# Patient Record
Sex: Female | Born: 1935 | Race: White | Hispanic: No | Smoking: Never smoker
Health system: Southern US, Community
[De-identification: ages and names within clinical notes are randomized; demographics above are authoritative.]

## PROBLEM LIST (undated history)

## (undated) DIAGNOSIS — J309 Allergic rhinitis, unspecified: Secondary | ICD-10-CM

## (undated) DIAGNOSIS — K648 Other hemorrhoids: Secondary | ICD-10-CM

## (undated) DIAGNOSIS — K5730 Diverticulosis of large intestine without perforation or abscess without bleeding: Secondary | ICD-10-CM

## (undated) DIAGNOSIS — K602 Anal fissure, unspecified: Secondary | ICD-10-CM

## (undated) DIAGNOSIS — K219 Gastro-esophageal reflux disease without esophagitis: Secondary | ICD-10-CM

## (undated) DIAGNOSIS — E785 Hyperlipidemia, unspecified: Secondary | ICD-10-CM

## (undated) DIAGNOSIS — M1990 Unspecified osteoarthritis, unspecified site: Secondary | ICD-10-CM

## (undated) DIAGNOSIS — G629 Polyneuropathy, unspecified: Secondary | ICD-10-CM

## (undated) DIAGNOSIS — J45909 Unspecified asthma, uncomplicated: Secondary | ICD-10-CM

## (undated) HISTORY — DX: Gastro-esophageal reflux disease without esophagitis: K21.9

## (undated) HISTORY — DX: Diverticulosis of large intestine without perforation or abscess without bleeding: K57.30

## (undated) HISTORY — DX: Hyperlipidemia, unspecified: E78.5

## (undated) HISTORY — DX: Allergic rhinitis, unspecified: J30.9

## (undated) HISTORY — PX: HEMORRHOID SURGERY: SHX153

## (undated) HISTORY — DX: Other hemorrhoids: K64.8

## (undated) HISTORY — DX: Polyneuropathy, unspecified: G62.9

## (undated) HISTORY — DX: Unspecified osteoarthritis, unspecified site: M19.90

## (undated) HISTORY — PX: TONSILLECTOMY: SUR1361

## (undated) HISTORY — DX: Unspecified asthma, uncomplicated: J45.909

## (undated) HISTORY — PX: NASAL SINUS SURGERY: SHX719

## (undated) HISTORY — DX: Anal fissure, unspecified: K60.2

## (undated) HISTORY — PX: BIOPSY BREAST: PRO8

---

## 1998-01-13 ENCOUNTER — Other Ambulatory Visit: Admission: RE | Admit: 1998-01-13 | Discharge: 1998-01-13 | Payer: Self-pay | Admitting: Obstetrics and Gynecology

## 1999-02-01 ENCOUNTER — Other Ambulatory Visit: Admission: RE | Admit: 1999-02-01 | Discharge: 1999-02-01 | Payer: Self-pay | Admitting: Family Medicine

## 2000-01-17 ENCOUNTER — Other Ambulatory Visit: Admission: RE | Admit: 2000-01-17 | Discharge: 2000-01-17 | Payer: Self-pay | Admitting: Family Medicine

## 2001-01-16 ENCOUNTER — Other Ambulatory Visit: Admission: RE | Admit: 2001-01-16 | Discharge: 2001-01-16 | Payer: Self-pay | Admitting: Family Medicine

## 2002-03-21 ENCOUNTER — Encounter (INDEPENDENT_AMBULATORY_CARE_PROVIDER_SITE_OTHER): Payer: Self-pay | Admitting: Gastroenterology

## 2003-02-21 ENCOUNTER — Other Ambulatory Visit: Admission: RE | Admit: 2003-02-21 | Discharge: 2003-02-21 | Payer: Self-pay | Admitting: Family Medicine

## 2005-03-23 ENCOUNTER — Other Ambulatory Visit: Admission: RE | Admit: 2005-03-23 | Discharge: 2005-03-23 | Payer: Self-pay | Admitting: Family Medicine

## 2006-03-07 ENCOUNTER — Ambulatory Visit: Payer: Self-pay | Admitting: Internal Medicine

## 2007-02-12 ENCOUNTER — Encounter: Admission: RE | Admit: 2007-02-12 | Discharge: 2007-02-12 | Payer: Self-pay | Admitting: Family Medicine

## 2007-03-06 DIAGNOSIS — J309 Allergic rhinitis, unspecified: Secondary | ICD-10-CM | POA: Insufficient documentation

## 2007-03-06 DIAGNOSIS — J45909 Unspecified asthma, uncomplicated: Secondary | ICD-10-CM

## 2007-03-06 DIAGNOSIS — J4520 Mild intermittent asthma, uncomplicated: Secondary | ICD-10-CM | POA: Insufficient documentation

## 2007-03-08 ENCOUNTER — Encounter: Payer: Self-pay | Admitting: Internal Medicine

## 2007-03-12 ENCOUNTER — Ambulatory Visit: Payer: Self-pay | Admitting: Gastroenterology

## 2008-08-01 ENCOUNTER — Ambulatory Visit: Payer: Self-pay | Admitting: Internal Medicine

## 2008-08-01 DIAGNOSIS — K219 Gastro-esophageal reflux disease without esophagitis: Secondary | ICD-10-CM

## 2008-08-08 ENCOUNTER — Ambulatory Visit (HOSPITAL_COMMUNITY): Admission: RE | Admit: 2008-08-08 | Discharge: 2008-08-08 | Payer: Self-pay | Admitting: Internal Medicine

## 2008-09-12 ENCOUNTER — Ambulatory Visit: Payer: Self-pay | Admitting: Internal Medicine

## 2008-09-18 ENCOUNTER — Telehealth (INDEPENDENT_AMBULATORY_CARE_PROVIDER_SITE_OTHER): Payer: Self-pay | Admitting: *Deleted

## 2008-09-19 ENCOUNTER — Ambulatory Visit: Payer: Self-pay | Admitting: Gastroenterology

## 2010-01-12 ENCOUNTER — Telehealth: Payer: Self-pay | Admitting: Gastroenterology

## 2010-03-16 NOTE — Progress Notes (Signed)
Summary: refill  Phone Note Other Incoming Call back at [facility] Phone [phone]   Caller: Pt's husband Details for Reason: Refill Summary of Call: Pt. needs refill of ranitidine, 90-day supply, with 3 refills faxed to [facility] (mail order). Any questions, please call. Initial call taken by: Schuyler Amor,  January 12, 2010 11:10 AM    Prescriptions: RANITIDINE HCL 300 MG  TABS (RANITIDINE HCL) take one pill shortly before bedtime every night  #90 x 3   Entered by:   Chales Abrahams CMA (AAMA)   Authorized by:   Rachael Fee MD   Signed by:   Chales Abrahams CMA (AAMA) on 01/12/2010   Method used:   Electronically to        [facility] (retail)             ,          Ph: [phone]       Fax: [phone]   RxID:   [id number]

## 2010-06-29 NOTE — Assessment & Plan Note (Signed)
[facility]                         GASTROENTEROLOGY OFFICE NOTE   CARO, BRUNDIDGE                   MRN:          [id number]  DATE:03/12/2007                            DOB:          06-23-1934    PRIMARY CARE PHYSICIAN:  Dr. Talmadge Coventry.   REASON FOR REFERRAL:  Dr. Smith Mince asked me to evaluate Sherry Guzman in  consultation regarding chronic left lower quadrant pain.   HISTORY OF PRESENT ILLNESS:  Sherry Guzman is a very pleasant 76 year old  woman who has had 6-8 months of left lower quadrant discomfort.  She  said in June it was pretty severe.  She was evaluated by Dr. Smith Mince  who thought that she might have some diverticular issues.  She was not  started specifically on antibiotics, but since then her symptoms have  generally improved, although she still has a discomfort in the left  lower quadrant.  She has been avoiding nuts and berries and  strawberries.  She says around the same time her bowel movements changed  and she is having rather scybalous stools.  She has a feeling of  incomplete evacuation.  She has had no rectal bleeding.  She began  taking Senokot and noticed some improvement in her symptoms.  Her  daughter is a colonic hydrotherapist and she underwent 3 full colon  cleansings and said after the colon cleansings she felt completely back  to normal, but the gradually over several says she began having the  discomfort again.  She has had no fevers or chills.  She has had some  lab tests and imaging studies to work this up.  Lab tests done in  December 2008:  CBC was completely normal, complete metabolic profile  was normal, her TSH was normal, she did have an abdominal urinalysis and  was started on antibiotics; this was an E. coli UTI.  She had a CT scan  also December 2008 with IV and oral contrast, normal abdomen, normal  pelvis.   REVIEW OF SYSTEMS:  Notable for no rectal bleeding, stable weight,  otherwise  is essentially normal and is available on her nursing intake  sheet.   PAST MEDICAL HISTORY:  1. Elevated cholesterol.  2. Status post hemorrhoid surgery 30 years ago.  3. Breast surgery 30 years ago.   CURRENT MEDICINES:  Multivitamin and calcium.   ALLERGIES:  NO KNOWN DRUG ALLERGIES.   SOCIAL HISTORY:  Married, one daughter, retired from [employer].  Nonsmoker, nondrinker.   FAMILY HISTORY:  No colon cancer, colon polyps in family.   PHYSICAL EXAMINATION:  Five foot five inches, 144 pounds, blood pressure  124/72, pulse 80.  CONSTITUTIONAL:  Generally well-appearing.  NEUROLOGIC:  Alert and oriented x3.  EYES:  Extraocular movements intact.  MOUTH:  Oropharynx moist, no lesions.  NECK:  Supple no lymphadenopathy.  CARDIOVASCULAR:  Heart regular rate and rhythm.  LUNGS:  Clear to auscultation bilaterally.  ABDOMEN:  Soft, nontender, nondistended, normal bowel sounds.  EXTREMITIES:  No lower extremity edema.  SKIN:  No rashes or lesions on visible extremities.   ASSESSMENT/PLAN:  A 76 year old woman with chronic left lower  quadrant  discomfort, change in bowel habits around the same time (6-8 months).  I  did not mention above but she did have a full colonoscopy with Dr. Victorino Dike February of 2004.  He describes some mild diverticulosis, is  otherwise essentially normal.  She was instructed to have another  colonoscopy 10 years from then.  I suspect her discomforts are related  to her change in bowel habits.  I have low suspicion for any malignancy  or neoplasm causing her symptoms.  That being said, will have a low  threshold to repeat colonoscopy since her last one was 5 years ago.  For  now, she will try Citrucel fiber supplementations, titrating slowly  upward and she will return to see me in 6-8 week's time and sooner if  needed.  If she is not completely back to normal after this regimen then  I would proceed with full colonoscopy before any other attempts at   therapy.     Rachael Fee, MD  Electronically Signed    DPJ/MedQ  DD: 03/12/2007  DT: 03/12/2007  Job #: [id number]   cc:   Talmadge Coventry, M.D.

## 2010-07-02 NOTE — Assessment & Plan Note (Signed)
[facility]                             PULMONARY OFFICE NOTE   ARLIN, SAVONA                   MRN:          [id number]  DATE:03/07/2006                            DOB:          February 01, 1935    PROBLEM:  1. Allergic rhinitis.  2. Asthma.   HISTORY:  This never smoker had been followed at [facility] where I last saw her in 2002.  We followed her  husband and she wants to reestablish for follow up of her respiratory  problems here.  She had quit allergy vaccine about five years ago.  Dr.  Smith Mince is her primary physician.  Today, she complains of pain in the  right ear and cough.  She had a bronchitis that moved to her head as a  sinusitis and she woke this morning about 1 a.m. with right ear pain.  She still has a pressure sensation in that ear.  She had been treating  herself with Sudafed and saline nasal lavage.   MEDICATIONS:  Omacor, aspirin 81 mg, multivitamins.   No medication allergy.   OBJECTIVE:  Weight 144 pounds.  BP 110/68, pulse regular 81, room air  saturation 98%.  External canal on the right is red.  Tympanic membrane  looks okay.  Left ear looks good.  I find no adenopathy.  Pharynx is not  red.  There is a little nasal stuffiness with no postnasal drainage.  No  periorbital edema.  Chest is clear.  Heart sounds regular without  murmur.   IMPRESSION:  Right otitis externa.   PLAN:  1. Augmentin 875 mg b.i.d. for seven days.  2. A-B Otic ear drops b.i.d. in ear as needed for comfort.  3. Continue Sudafed and saline lavage as needed.  4. If she fails to clear within the week, she is to let us know.  If      she clears and feels well, then I will see her in a year, earlier      p.r.n.     Clinton D. Maple Hudson, MD, [facility] Bollman, FACP  Electronically Signed    CDY/MedQ  DD: 03/11/2006  DT: 03/11/2006  Job #: [id number]

## 2010-11-12 ENCOUNTER — Other Ambulatory Visit: Payer: Self-pay | Admitting: Gastroenterology

## 2010-11-15 MED ORDER — RANITIDINE HCL 150 MG PO TABS: 150.0000 mg | ORAL_TABLET | Freq: Two times a day (BID) | ORAL | Status: DC

## 2010-11-15 NOTE — Telephone Encounter (Signed)
Pt appt made to follow up for med refill

## 2010-12-10 ENCOUNTER — Other Ambulatory Visit: Payer: Self-pay

## 2010-12-17 ENCOUNTER — Encounter: Payer: Self-pay | Admitting: Gastroenterology

## 2010-12-17 ENCOUNTER — Ambulatory Visit (INDEPENDENT_AMBULATORY_CARE_PROVIDER_SITE_OTHER): Payer: Medicare Other | Admitting: Gastroenterology

## 2010-12-17 VITALS — BP 122/68 | HR 80 | Ht 65.0 in | Wt 143.0 lb

## 2010-12-17 DIAGNOSIS — K219 Gastro-esophageal reflux disease without esophagitis: Secondary | ICD-10-CM

## 2010-12-17 MED ORDER — RANITIDINE HCL 300 MG PO TABS: 300.0000 mg | ORAL_TABLET | Freq: Every day | ORAL | Status: DC

## 2010-12-17 MED ORDER — AZELASTINE HCL 0.15 % NA SOLN: 205.5000 ug | Freq: Every day | NASAL | Status: DC | PRN

## 2010-12-17 NOTE — Progress Notes (Signed)
Review of gastrointestinal problems:  1. Routine risk for colon cancer, last colonoscopy with Dr. Corinda Gubler 2004 showed diverticulosis but no polyps. Next colonoscopy February 2014.  2. GERD: intermittent chest, indigestion symptoms. Improved with H2 blocker. Reflux documented on 2010 upper GI   HPI: This is a  very pleasant 76 year old woman who I last saw about 2 years ago.  Everything is perfect.  Zantac 300mg  qhs works very well.  No dysphagia.  Overall stable weight. No overt gi bleeding.  She does have intermittent mild discomfort, attributes it to gas.  In past few months, her bowels have been looser than usual.  Stopped alleve and that helped, much better.    Past Medical History  Diagnosis Date  . Unspecified asthma   . Allergic rhinitis, cause unspecified   . GERD (gastroesophageal reflux disease)   . Hyperlipemia   . Diverticulosis of colon (without mention of hemorrhage)     Past Surgical History  Procedure Date  . Nasal sinus surgery   . Hemorrhoid surgery   . Biopsy breast   . Tonsillectomy     Current Outpatient Prescriptions  Medication Sig Dispense Refill  . aspirin 81 MG tablet Take 81 mg by mouth daily.        . Azelastine HCl (ASTEPRO) 0.15 % SOLN Place into the nose. As directed       . ranitidine (ZANTAC) 300 MG tablet One tablet by mouth one hour before  bedtime       . rosuvastatin (CRESTOR) 10 MG tablet Take 10 mg by mouth daily.          Allergies as of 12/17/2010  . (No Known Allergies)    Family History  Problem Relation Age of Onset  . Lung cancer Father   . Heart disease Mother   . Colon cancer Neg Hx     History   Social History  . Marital Status: Married    Spouse Name: N/A    Number of Children: N/A  . Years of Education: N/A   Occupational History  . Retired     Social History Main Topics  . Smoking status: Never Smoker   . Smokeless tobacco: Never Used  . Alcohol Use: No  . Drug Use: No  . Sexually Active: Not on file    Other Topics Concern  . Not on file   Social History Narrative  . No narrative on file      Physical Exam: BP 122/68  Pulse 80  Ht 5\' 5"  (1.651 m)  Wt 143 lb (64.864 kg)  BMI 23.80 kg/m2 Constitutional: generally well-appearing Psychiatric: alert and oriented x3 Abdomen: soft, nontender, nondistended, no obvious ascites, no peritoneal signs, normal bowel sounds     Assessment and plan: 76 y.o. female with chronic GERD, well controlled on H2 blocker, routine risk for colon cancer  She has chronic GERD without alarm symptoms on H2 blockers once nightly. And going to refill her prescription for the H2 blocker. She'll continue once nightly. She also asked for a refill on her nasal spray astepro, she takes this 2 or 3 nights a week.  I was happy refill this prescription as well. She will return to see me in 2 years for her GERD symptoms and probably sooner for routine colon cancer screening.

## 2010-12-17 NOTE — Patient Instructions (Signed)
Continue zantac at night. Continue astepro as needed. Call if any changes in GI symptoms. Return to see Dr. Christella Hartigan in 2 years for GERD, sooner if needed.

## 2011-04-07 IMAGING — RF DG ESOPHAGUS
11 series · 20 of 23 positions shown · non-contrast
Comparison: None

CLINICAL DATA: Evaluate for reflux.

ESOPHOGRAM/BARIUM SWALLOW
TECHNIQUE: Combined double contrast and single contrast
examination performed using effervescent crystals, thick barium
liquid, and thin barium liquid.
Fluoroscopy time:  3.5 minutes.

[Series 1: run · 2 of 2 slices shown (1 of 11)]
[im 1/2]
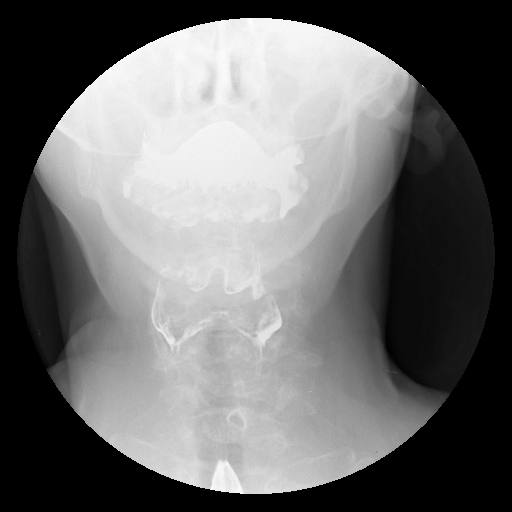
[im 2/2]
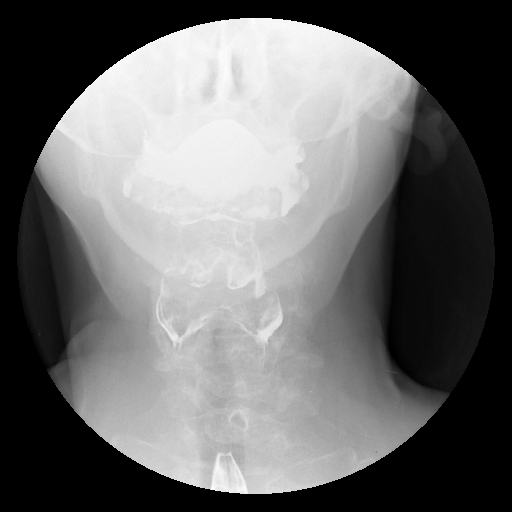

[Series 2: run · 1 of 2 slices shown (2 of 11)]
[im 1/2]
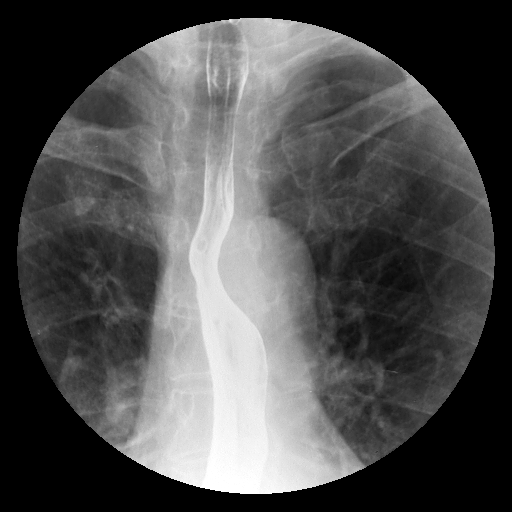

[Series 3: run · 2 of 2 slices shown (3 of 11)]
[im 1/2]
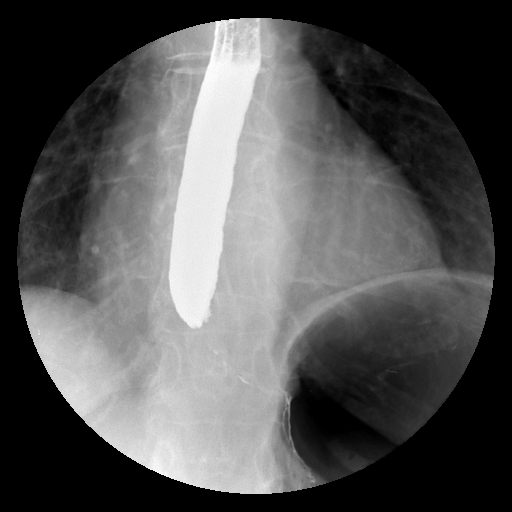
[im 2/2]
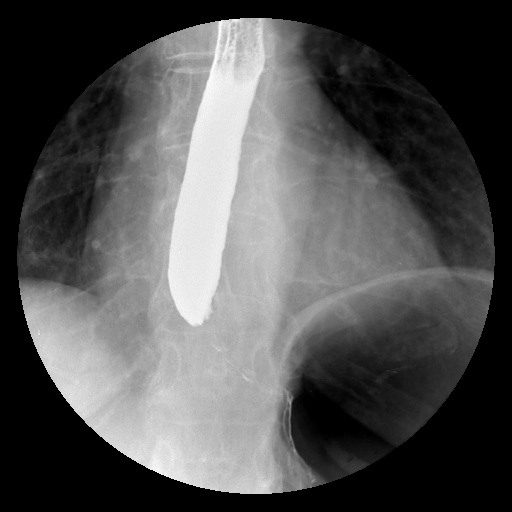

[Series 4: run · 2 of 2 slices shown (4 of 11)]
[im 1/2]
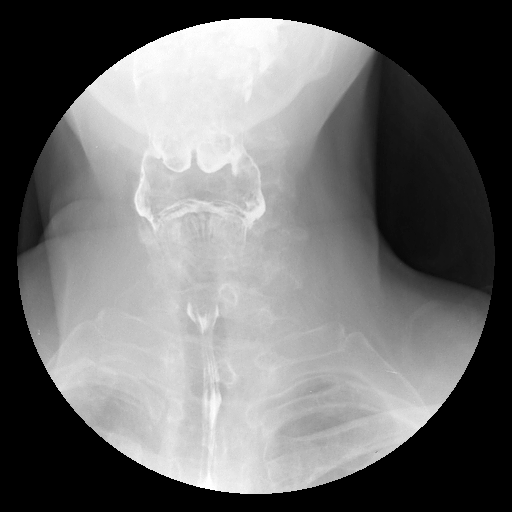
[im 2/2]
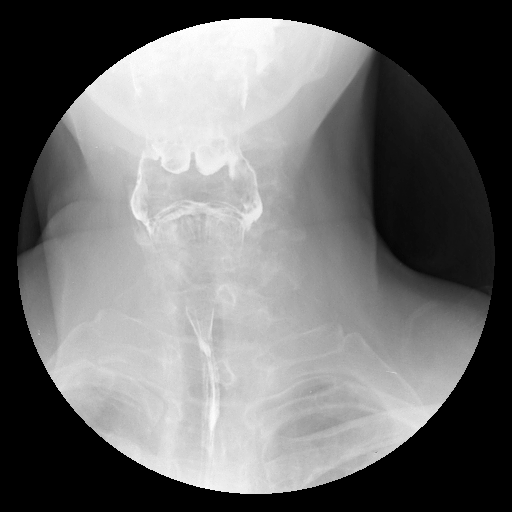

[Series 5: run · 2 of 2 slices shown (5 of 11)]
[im 1/2]
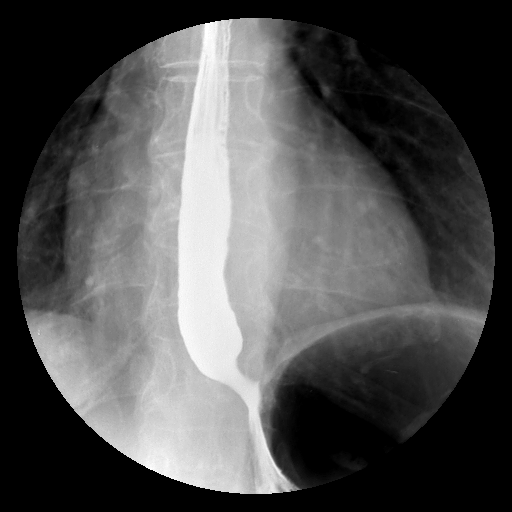
[im 2/2]
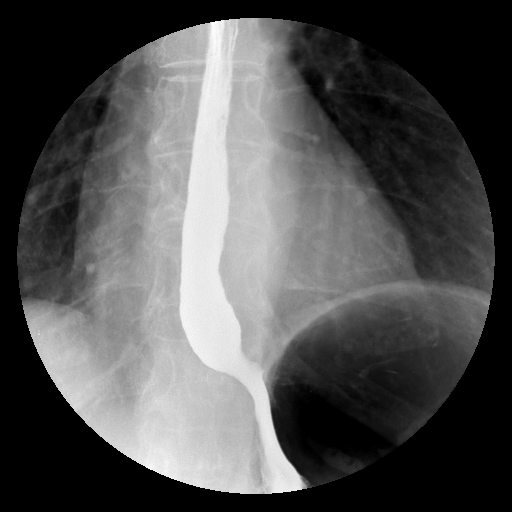

[Series 6: run · 3 of 4 slices shown (6 of 11)]
[im 1/4]
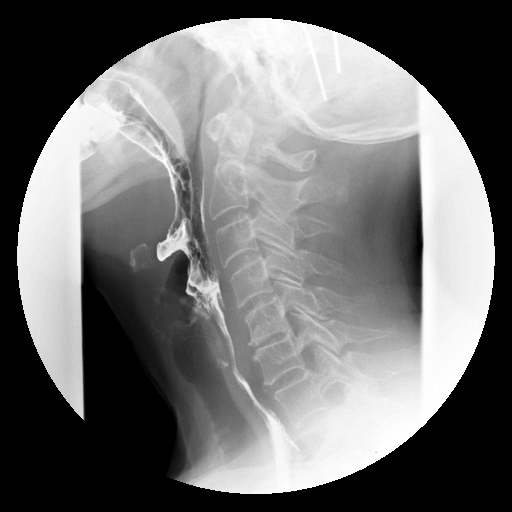
[im 3/4]
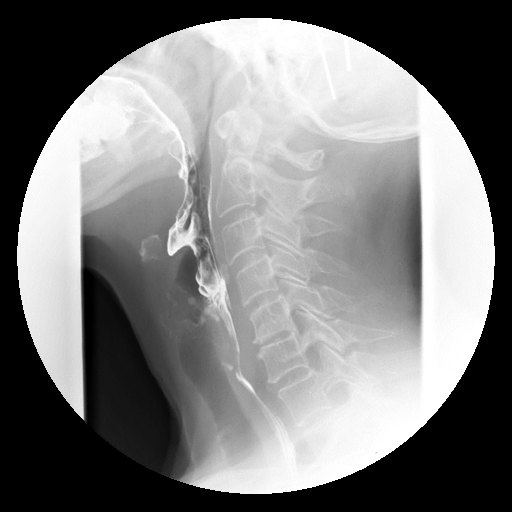
[im 4/4]
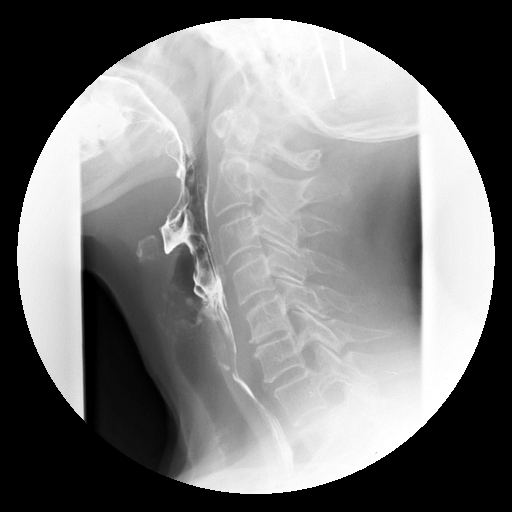

[Series 7: run · 2 of 2 slices shown (7 of 11)]
[im 1/2]
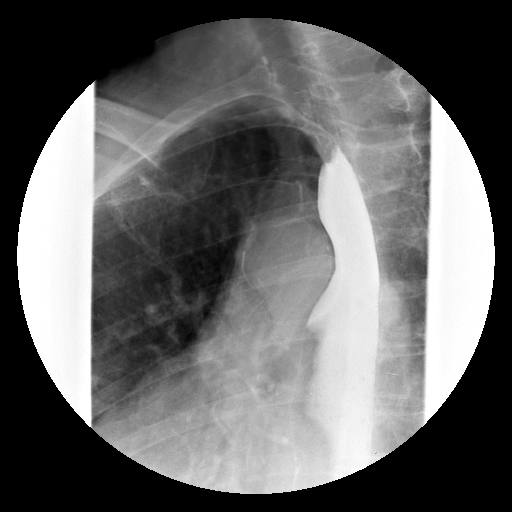
[im 2/2]
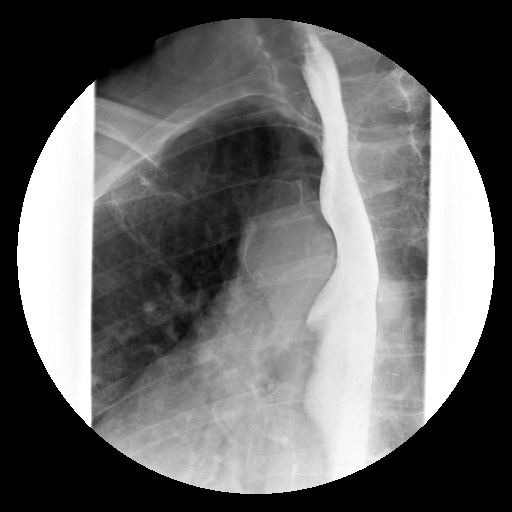

[Series 8: run · 2 of 2 slices shown (8 of 11)]
[im 1/2]
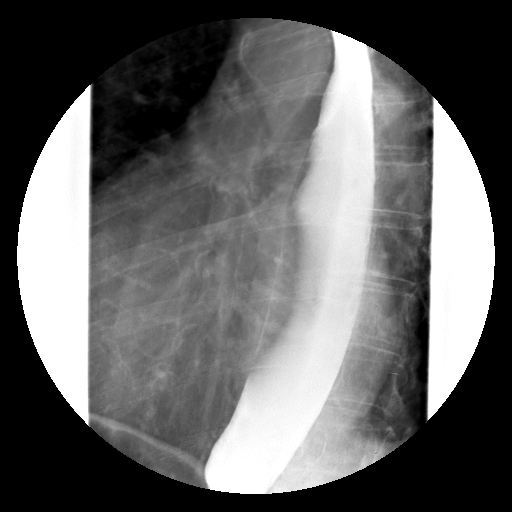
[im 2/2]
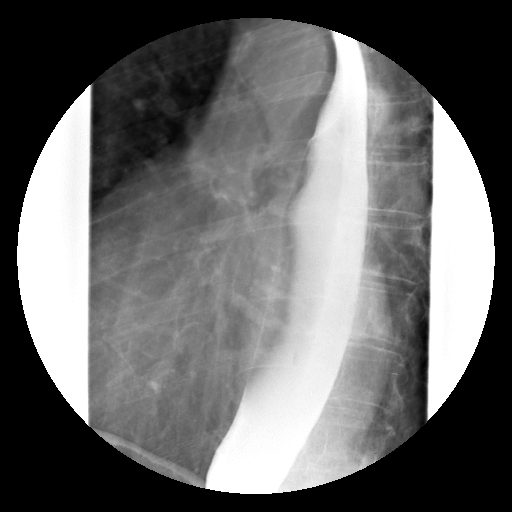

[Series 9: run · 1 of 2 slices shown (9 of 11)]
[im 1/2]
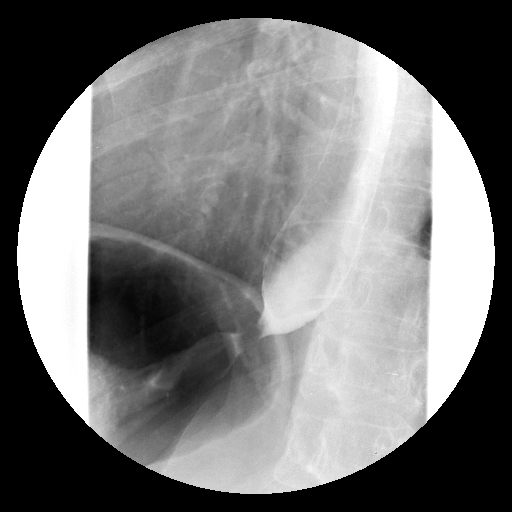

[Series 10: run · 1 of 1 slices shown (10 of 11)]
[im 1/1]
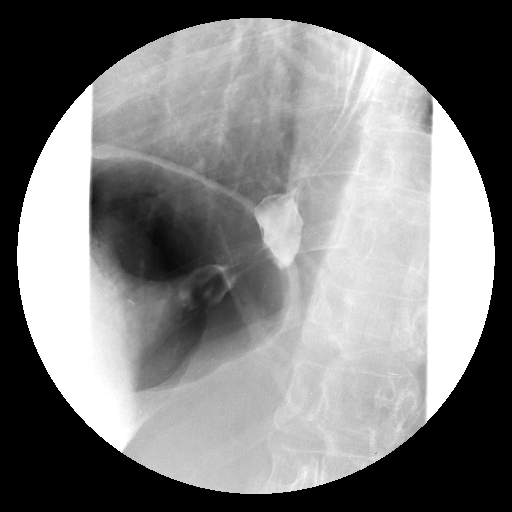

[Series 11: run · 2 of 2 slices shown (11 of 11)]
[im 1/2]
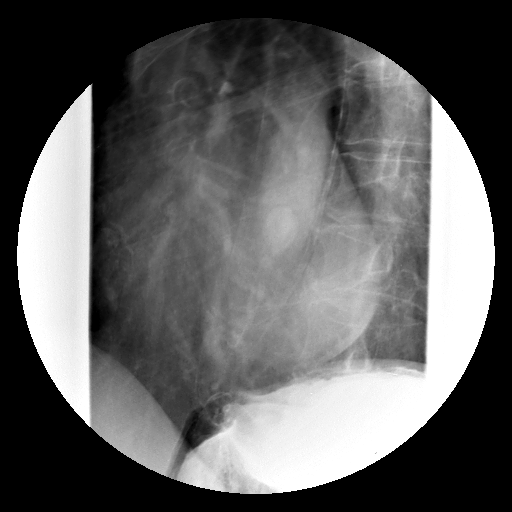
[im 2/2]
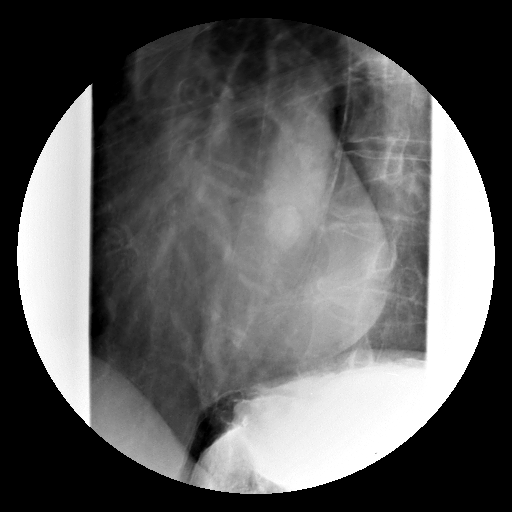

[20 of 23 positions shown; findings below may reference images not displayed]

FINDINGS: Swallowing function appeared normal.  No penetration or
aspiration of barium.  Symmetrical valleculae and piriform sinuses.
Normal esophageal motility.  No esophageal stricture.  The patient
swallowed a 13 mm barium tablet which traversed the esophagus and
entered the stomach without delay. No significant hiatal hernia.
There was moderate gastroesophageal reflux.
IMPRESSION: GE reflux.

## 2011-11-08 DIAGNOSIS — M810 Age-related osteoporosis without current pathological fracture: Secondary | ICD-10-CM | POA: Insufficient documentation

## 2011-11-28 ENCOUNTER — Other Ambulatory Visit: Payer: Self-pay | Admitting: Gastroenterology

## 2011-11-28 MED ORDER — RANITIDINE HCL 300 MG PO TABS: 300.0000 mg | ORAL_TABLET | Freq: Every day | ORAL | Status: DC

## 2011-11-28 NOTE — Telephone Encounter (Signed)
Pt aware medication has been sent to the pharmacy

## 2012-01-25 ENCOUNTER — Ambulatory Visit: Payer: Medicare Other | Attending: Family Medicine

## 2012-01-25 DIAGNOSIS — M25519 Pain in unspecified shoulder: Secondary | ICD-10-CM | POA: Insufficient documentation

## 2012-01-25 DIAGNOSIS — IMO0001 Reserved for inherently not codable concepts without codable children: Secondary | ICD-10-CM | POA: Insufficient documentation

## 2012-03-02 ENCOUNTER — Encounter: Payer: Self-pay | Admitting: Gastroenterology

## 2012-03-19 ENCOUNTER — Encounter: Payer: Self-pay | Admitting: Gastroenterology

## 2012-05-25 ENCOUNTER — Ambulatory Visit (AMBULATORY_SURGERY_CENTER): Payer: Medicare Other

## 2012-05-25 VITALS — Ht 65.0 in | Wt 138.0 lb

## 2012-05-25 DIAGNOSIS — Z1211 Encounter for screening for malignant neoplasm of colon: Secondary | ICD-10-CM

## 2012-05-25 MED ORDER — MOVIPREP 100 G PO SOLR: 1.0000 | Freq: Once | ORAL | Status: DC

## 2012-06-08 ENCOUNTER — Encounter: Payer: Self-pay | Admitting: Gastroenterology

## 2012-06-08 ENCOUNTER — Ambulatory Visit (AMBULATORY_SURGERY_CENTER): Payer: Medicare Other | Admitting: Gastroenterology

## 2012-06-08 VITALS — BP 123/67 | HR 58 | Temp 96.4°F | Resp 16 | Ht 65.0 in | Wt 138.0 lb

## 2012-06-08 DIAGNOSIS — Z1211 Encounter for screening for malignant neoplasm of colon: Secondary | ICD-10-CM

## 2012-06-08 MED ORDER — SODIUM CHLORIDE 0.9 % IV SOLN: 500.0000 mL | INTRAVENOUS | Status: DC

## 2012-06-08 NOTE — Op Note (Signed)
[facility] [address] [place], [address]   COLONOSCOPY PROCEDURE REPORT  PATIENT: Sherry Guzman, Sherry Guzman  MR#: [id number] BIRTHDATE: [birth year] , 77  yrs. old GENDER: Female ENDOSCOPIST: Rachael Fee, MD PROCEDURE DATE:  06/08/2012 PROCEDURE:   Colonoscopy, screening ASA CLASS:   Class II INDICATIONS:average risk screening. MEDICATIONS: Fentanyl 75 mcg IV, Versed 7 mg IV, and These medications were titrated to patient response per physician's verbal order  DESCRIPTION OF PROCEDURE:   After the risks benefits and alternatives of the procedure were thoroughly explained, informed consent was obtained.  A digital rectal exam revealed no abnormalities of the rectum.   The LB [id number]-[id number]  endoscope was introduced through the anus and advanced to the cecum, which was identified by both the appendix and ileocecal valve. No adverse events experienced.   The quality of the prep was good.  The instrument was then slowly withdrawn as the colon was fully examined.   COLON FINDINGS: A normal appearing cecum, ileocecal valve, and appendiceal orifice were identified.  The ascending, hepatic flexure, transverse, splenic flexure, descending, sigmoid colon and rectum appeared unremarkable.  No polyps or cancers were seen. Retroflexed views revealed no abnormalities. The time to cecum=10 minutes 21 seconds.  Withdrawal time=4 minutes 54 seconds.  The scope was withdrawn and the procedure completed. COMPLICATIONS: There were no complications.  ENDOSCOPIC IMPRESSION: Tortuous but otherwise normal colon No polyps or cancers  RECOMMENDATIONS: Given your age, you will not need another colonoscopy for colon cancer screening or polyp surveillance.  These types of tests usually stop around the age 42-80.   eSigned:  Rachael Fee, MD 06/08/2012 1:45 PM

## 2012-06-08 NOTE — Patient Instructions (Addendum)

## 2012-06-08 NOTE — Progress Notes (Signed)
Tortuous colon noted by Dr. Christella Hartigan.

## 2012-06-08 NOTE — Progress Notes (Signed)
Patient did not experience any of the following events: a burn prior to discharge; a fall within the facility; wrong site/side/patient/procedure/implant event; or a hospital transfer or hospital admission upon discharge from the facility. (G8907) Patient did not have preoperative order for IV antibiotic SSI prophylaxis. (G8918)  

## 2012-06-11 ENCOUNTER — Telehealth: Payer: Self-pay

## 2012-06-11 NOTE — Telephone Encounter (Signed)
  Follow up Call-  Call back number 06/08/2012  Post procedure Call Back phone  # [phone]  Permission to leave phone message Yes     Patient questions:  Do you have a fever, pain , or abdominal swelling? no Pain Score  0 *  Have you tolerated food without any problems? yes  Have you been able to return to your normal activities? yes  Do you have any questions about your discharge instructions: Diet   no Medications  no Follow up visit  no  Do you have questions or concerns about your Care? no  Actions: * If pain score is 4 or above: No action needed, pain <4.

## 2012-10-05 DIAGNOSIS — H04129 Dry eye syndrome of unspecified lacrimal gland: Secondary | ICD-10-CM | POA: Insufficient documentation

## 2012-10-19 ENCOUNTER — Other Ambulatory Visit: Payer: Self-pay | Admitting: Gastroenterology

## 2013-06-18 ENCOUNTER — Other Ambulatory Visit: Payer: Self-pay | Admitting: Gastroenterology

## 2013-11-23 ENCOUNTER — Other Ambulatory Visit: Payer: Self-pay | Admitting: Gastroenterology

## 2014-02-15 ENCOUNTER — Other Ambulatory Visit: Payer: Self-pay | Admitting: Gastroenterology

## 2014-04-30 ENCOUNTER — Telehealth: Payer: Self-pay | Admitting: Gastroenterology

## 2014-05-01 NOTE — Telephone Encounter (Signed)
Left message on machine to call back pt needs f/u for refills

## 2014-05-02 NOTE — Telephone Encounter (Signed)
Returning your call. °

## 2014-05-02 NOTE — Telephone Encounter (Signed)
Left message on machine to call back  

## 2014-05-05 NOTE — Telephone Encounter (Signed)
Left message per pt request that appt needs to be made before medication will be refilled.

## 2014-05-06 MED ORDER — RANITIDINE HCL 300 MG PO TABS: 300.0000 mg | ORAL_TABLET | Freq: Every day | ORAL | Status: DC

## 2014-05-06 NOTE — Telephone Encounter (Signed)
1 refill sent ROV was scheduled no further refills until appt is kept

## 2014-07-01 ENCOUNTER — Ambulatory Visit (INDEPENDENT_AMBULATORY_CARE_PROVIDER_SITE_OTHER): Payer: Medicare Other | Admitting: Gastroenterology

## 2014-07-01 ENCOUNTER — Encounter: Payer: Self-pay | Admitting: Gastroenterology

## 2014-07-01 VITALS — BP 128/62 | HR 68 | Ht 65.0 in | Wt 143.0 lb

## 2014-07-01 DIAGNOSIS — K219 Gastro-esophageal reflux disease without esophagitis: Secondary | ICD-10-CM

## 2014-07-01 MED ORDER — RANITIDINE HCL 300 MG PO TABS: 300.0000 mg | ORAL_TABLET | Freq: Every day | ORAL | Status: DC

## 2014-07-01 NOTE — Progress Notes (Signed)
Review of gastrointestinal problems:  1. Routine risk for colon cancer, last colonoscopy with Dr. Corinda GublerLeBauer 2004 showed diverticulosis but no polyps. Next colonoscopy February 2014. Colonoscopy 05/2012 was tortuous but no polyps.  NO recommended recall given age 80. GERD: intermittent chest, indigestion symptoms. Improved with H2 blocker. Reflux documented on 2010 upper GI    HPI: This is a  very pleasant 80 year old woman whom I last saw 2 or 3 years ago. She is here for her chronic GERD  Chief complaint is  GERD  Weight is unchanged.  She feels lovely.  Takes ranitidine  300 mg qhs.  as long as she takes the H2 blocker nightly at bedtime she feels very well  No dysphagia, stable weight.  No vomiting blood.  Feels great.    Past Medical History  Diagnosis Date  . Unspecified asthma(493.90)   . Allergic rhinitis, cause unspecified   . GERD (gastroesophageal reflux disease)   . Hyperlipemia   . Diverticulosis of colon (without mention of hemorrhage)     Past Surgical History  Procedure Laterality Date  . Nasal sinus surgery    . Hemorrhoid surgery    . Biopsy breast    . Tonsillectomy      Current Outpatient Prescriptions  Medication Sig Dispense Refill  . ALPRAZolam (XANAX) 0.25 MG tablet Take 0.25 mg by mouth as needed.     . Ascorbic Acid (VITAMIN C) 1000 MG tablet Take 1,000 mg by mouth daily.     Marland Kitchen. aspirin 81 MG tablet Take 81 mg by mouth daily.      . Azelastine HCl (ASTEPRO) 0.15 % SOLN Place 205.5 mcg into the nose daily as needed. 30 mL 3  . benzonatate (TESSALON) 100 MG capsule Take 100 mg by mouth as needed.     . Cholecalciferol (VITAMIN D3) 5000 UNITS TABS Take 1 tablet by mouth daily.     . cyclobenzaprine (FLEXERIL) 5 MG tablet Take 5 mg by mouth as needed.     Scarlette Shorts. IOPHEN C-NR 100-10 MG/5ML syrup Take 10 mLs by mouth as needed.     . naproxen (NAPROSYN) 500 MG tablet Take 500 mg by mouth as needed.     . ranitidine (ZANTAC) 300 MG tablet Take 1 tablet (300 mg  total) by mouth at bedtime. 90 tablet 0   No current facility-administered medications for this visit.    Allergies as of 07/01/2014 - Review Complete 07/01/2014  Allergen Reaction Noted  . Dust mite extract  07/01/2014  . Pollen extract  07/01/2014    Family History  Problem Relation Age of Onset  . Lung cancer Father   . Heart disease Mother   . Colon cancer Neg Hx     History   Social History  . Marital Status: Married    Spouse Name: N/A  . Number of Children: N/A  . Years of Education: N/A   Occupational History  . Retired     Social History Main Topics  . Smoking status: Never Smoker   . Smokeless tobacco: Never Used  . Alcohol Use: No  . Drug Use: No  . Sexual Activity: Not on file   Other Topics Concern  . Not on file   Social History Narrative     Physical Exam: Ht 5\' 5"  (1.651 m)  Wt 143 lb (64.864 kg)  BMI 23.80 kg/m2 Constitutional: generally well-appearing Psychiatric: alert and oriented x3 Abdomen: soft, nontender, nondistended, no obvious ascites, no peritoneal signs, normal bowel sounds   Assessment and  plan: 80 y.o. female with  chronic GERD I refilled her prescription for ranitidine 300 mg pills. I am happy to refill this again in one year from now without need for office appointment however if she still needs prescription strength medicine in 2 years I would like to see her in the office again. She has no alarm symptoms.   Rob Buntinganiel Jacobs, MD [facility] 07/01/2014, 8:24 AM

## 2014-07-01 NOTE — Patient Instructions (Signed)
New prescription called in for ranitidine 300mg , take one at bedtime nightly. Will refill in 1 year.  If you still need prescription med in 2 years, will ask that you return to office.

## 2015-06-30 ENCOUNTER — Other Ambulatory Visit: Payer: Self-pay | Admitting: Gastroenterology

## 2016-03-26 ENCOUNTER — Other Ambulatory Visit: Payer: Self-pay | Admitting: Gastroenterology

## 2016-12-21 ENCOUNTER — Other Ambulatory Visit: Payer: Self-pay | Admitting: Gastroenterology

## 2017-03-07 ENCOUNTER — Telehealth: Payer: Self-pay | Admitting: Gastroenterology

## 2017-03-07 NOTE — Telephone Encounter (Signed)
Pt complains of pain when sitting with hemorrhoids, uses "lidocaine cream" daily. PCP and was told internal and external.   She was also given suppository to use daily. No bleeding, no pain with BM.  Has burning in the area.  Bowel movements are normal.  PCP recommends banding.  Please advise, can I set up with on of the docs for banding?

## 2017-03-07 NOTE — Telephone Encounter (Signed)
The pt has been scheduled for banding appt with Dr Leone PayorGessner.  Pt aware and is not on any blood thinner.

## 2017-03-07 NOTE — Telephone Encounter (Signed)
That is OK with me, thanks  

## 2017-03-13 ENCOUNTER — Ambulatory Visit (INDEPENDENT_AMBULATORY_CARE_PROVIDER_SITE_OTHER): Payer: Medicare Other | Admitting: Internal Medicine

## 2017-03-13 ENCOUNTER — Encounter: Payer: Self-pay | Admitting: Internal Medicine

## 2017-03-13 VITALS — BP 110/70 | HR 74 | Ht 65.0 in | Wt 145.1 lb

## 2017-03-13 DIAGNOSIS — K6289 Other specified diseases of anus and rectum: Secondary | ICD-10-CM | POA: Diagnosis not present

## 2017-03-13 DIAGNOSIS — M533 Sacrococcygeal disorders, not elsewhere classified: Secondary | ICD-10-CM | POA: Diagnosis not present

## 2017-03-13 NOTE — Progress Notes (Addendum)
Sherry Guzman 82 y.o. 12/09/1934 [id number]  Assessment & Plan:   Encounter Diagnoses  Name Primary?  . Coccydynia Yes  . Rectal pain    1. Coccydynia- Pt's presentation likely d/t MSK etiology. Pain with palpation of coccyx that is aggravated with sitting and hx of moving heavy loads support the dx of coccydynia. Colonoscopy not warranted. Pt told she can stop suppositories and to do symptomatic tx with tylenol, aleve, and ice packs. Follow up with PCP if pain returns or worsens for ortho/sports medicine referral.  I have personally seen the patient, and formulate and plan the student has documented for me as a a scribe.  Sounds like prior fissure but current sxs and findings most consistent w/ coccodynia  Sherry Booparl E. Gessner, MD, [facility]  Cc;Sherry Guzman, Sherry EkeKaren L, MD   Subjective:   Chief Complaint: Rectal pain  HPI Pt is a 83 yo very nice female presenting today with complains of worsening rectal pain for a few weeks. She was seen at her PCP and was dx'd with internal/external hemorrhoids, and tx'd with lidocaine cream daily and suppository to use daily.   Today she reports that her pain is a lot better but would like a consult. The pain first started in the fall, she noted the pain after moving heavy loads of fertilizer. Since then the pain has come and gone with the most recent episode starting few weeks ago. Her pain is dull and achy that only occurs with sitting. She does not have have sharp/tearing pains, itching, bleeding. Her bowel movements are nml. No other changes otherwise. No pain with defecation.  In the Fall she apparently did have a tear and perhaps sharper pain in rectum assoc w/ defecaton.  Allergies  Allergen Reactions  . Dust Mite Extract   . Pollen Extract    Current Meds  Medication Sig  . ALPRAZolam (XANAX) 0.25 MG tablet Take 0.25 mg by mouth as needed.   . Ascorbic Acid (VITAMIN C) 1000 MG tablet Take 1,000 mg by mouth daily.   Marland Kitchen. aspirin 81 MG tablet  Take 81 mg by mouth daily.    . Cholecalciferol (VITAMIN D3) 5000 UNITS TABS Take 1 tablet by mouth daily.   Scarlette Shorts. IOPHEN C-NR 100-10 MG/5ML syrup Take 10 mLs by mouth as needed.   . ranitidine (ZANTAC) 300 MG tablet TAKE 1 TABLET AT BEDTIME  . rosuvastatin (CRESTOR) 10 MG tablet Take 10 mg by mouth daily.  . [DISCONTINUED] Azelastine HCl (ASTEPRO) 0.15 % SOLN Place 205.5 mcg into the nose daily as needed.  . [DISCONTINUED] benzonatate (TESSALON) 100 MG capsule Take 100 mg by mouth as needed.   . [DISCONTINUED] cyclobenzaprine (FLEXERIL) 5 MG tablet Take 5 mg by mouth as needed.   . [DISCONTINUED] naproxen (NAPROSYN) 500 MG tablet Take 500 mg by mouth as needed.    Past Medical History:  Diagnosis Date  . Allergic rhinitis, cause unspecified   . Anal fissure   . Diverticulosis of colon (without mention of hemorrhage)   . GERD (gastroesophageal reflux disease)   . Hyperlipemia   . Internal hemorrhoids   . Osteoarthritis   . Peripheral neuropathy   . Unspecified asthma(493.90)    Past Surgical History:  Procedure Laterality Date  . BIOPSY BREAST    . HEMORRHOID SURGERY    . NASAL SINUS SURGERY    . TONSILLECTOMY     Social History   Social History Narrative   Married, retired   No alcohol, tobacco or drug use  family history includes Heart disease in her mother; Lung cancer in her father.   Review of Systems Positive for rectal pain. Negative for melena, hematochezia, change in bowel habits, appetite, constipation or diarrhea. Negative for all other systems.   Objective:   Physical Exam  @BP  110/70   Pulse 74   Ht 5\' 5"  (1.651 m)   Wt 145 lb 2 oz (65.8 kg)   BMI 24.15 kg/m @  General:  Well-developed, well-nourished and in no acute distress Eyes:  anicteric. Rectal: External: few skin tags visualized around the anal sphincter. DRE: tenderness with palpation of coccyx. Anoscope: few external hemorrhoids visualized, no signs of prolapse or thrombosis.  Neuro:  A&O x  3.  Psych:  appropriate mood and  Affect.   Data Reviewed: PCP/GI notes, meds.

## 2017-03-13 NOTE — Patient Instructions (Addendum)
If you are age 81 or older, your body mass index should be between 23-30. Your Body mass index is 24.15 kg/m. If this is out of the aforementioned range listed, please consider follow up with your Primary Care Provider.  Please follow up with your primary care- Dr. Lenard Gallowayickter.  I appreciate the opportunity to care for you. Sherry Headarl Gessner, MD, [facility]

## 2017-05-09 ENCOUNTER — Encounter: Payer: Self-pay | Admitting: Neurology

## 2017-05-09 ENCOUNTER — Ambulatory Visit (INDEPENDENT_AMBULATORY_CARE_PROVIDER_SITE_OTHER): Payer: Medicare Other | Admitting: Neurology

## 2017-05-09 VITALS — BP 112/74 | HR 67 | Ht 65.0 in | Wt 142.5 lb

## 2017-05-09 DIAGNOSIS — R202 Paresthesia of skin: Secondary | ICD-10-CM | POA: Diagnosis not present

## 2017-05-09 MED ORDER — GABAPENTIN 100 MG PO CAPS: 300.0000 mg | ORAL_CAPSULE | Freq: Every day | ORAL | 11 refills | Status: DC

## 2017-05-09 NOTE — Progress Notes (Signed)
PATIENT: Sherry Guzman DOB: 08/23/1934  Chief Complaint  Patient presents with  . Peripheral Neuropathy    Reports worsening of peripheral neuropathy, over the last two years, in her bilateral feet.  Her symptoms cause her more problems at night.  She has never tried medications for this problem.  Marland Kitchen PCP    Dois Davenport, MD     HISTORICAL  Sherry Guzman is a 83 years old female seen in refer by primary care doctor Dois Davenport, for evaluation of peripheral neuropathy, initial evaluation was on May 09, 2017.  I reviewed and summarized the referring note, she has history of hyperlipidemia, most recent LDL 81 in December 2018, normal CMP with the exception of mild elevated glucose 116, normal TSH, CBC  She is active all her life, just retired in 2018, still enjoys yard work, since 2017, she began to notice numbness burning tingly sensation on the bottom of her feet, has been persistent since then, at the evening time sitting still trying to go to bed, she felt the urge to move her leg,  She denies bilateral hands paresthesia, no gait abnormality, no bowel bladder incontinence.  REVIEW OF SYSTEMS: Full 14 system review of systems performed and notable only for allergy, ringing in ears  ALLERGIES: Allergies  Allergen Reactions  . Dust Mite Extract   . Pollen Extract     HOME MEDICATIONS: Current Outpatient Medications  Medication Sig Dispense Refill  . aspirin 81 MG tablet Take 81 mg by mouth every other day.     . Cholecalciferol (VITAMIN D3) 5000 UNITS TABS Take 1 tablet by mouth daily.     . ranitidine (ZANTAC) 300 MG tablet TAKE 1 TABLET AT BEDTIME 90 tablet 2  . rosuvastatin (CRESTOR) 10 MG tablet Take 10 mg by mouth daily.     No current facility-administered medications for this visit.     PAST MEDICAL HISTORY: Past Medical History:  Diagnosis Date  . Allergic rhinitis, cause unspecified   . Anal fissure   . Diverticulosis of colon (without  mention of hemorrhage)   . GERD (gastroesophageal reflux disease)   . Hyperlipemia   . Internal hemorrhoids   . Osteoarthritis   . Peripheral neuropathy   . Unspecified asthma(493.90)     PAST SURGICAL HISTORY: Past Surgical History:  Procedure Laterality Date  . BIOPSY BREAST    . HEMORRHOID SURGERY    . NASAL SINUS SURGERY    . TONSILLECTOMY      FAMILY HISTORY: Family History  Problem Relation Age of Onset  . Lung cancer Father   . Heart disease Mother   . Colon cancer Neg Hx   . Colon polyps Neg Hx     SOCIAL HISTORY:  Social History   Socioeconomic History  . Marital status: Married    Spouse name: Not on file  . Number of children: 1  . Years of education: 81  . Highest education level: High school graduate  Occupational History  . Occupation: Retired   Engineer, production  . Financial resource strain: Not on file  . Food insecurity:    Worry: Not on file    Inability: Not on file  . Transportation needs:    Medical: Not on file    Non-medical: Not on file  Tobacco Use  . Smoking status: Never Smoker  . Smokeless tobacco: Never Used  Substance and Sexual Activity  . Alcohol use: No  . Drug use: No  . Sexual activity:  Not on file  Lifestyle  . Physical activity:    Days per week: Not on file    Minutes per session: Not on file  . Stress: Not on file  Relationships  . Social connections:    Talks on phone: Not on file    Gets together: Not on file    Attends religious service: Not on file    Active member of club or organization: Not on file    Attends meetings of clubs or organizations: Not on file    Relationship status: Not on file  . Intimate partner violence:    Fear of current or ex partner: Not on file    Emotionally abused: Not on file    Physically abused: Not on file    Forced sexual activity: Not on file  Other Topics Concern  . Not on file  Social History Narrative   Married, retired   No alcohol, tobacco or drug use    Right-handed.   4 cups of half/half caffeine of coffee, 1 glass of tea at lunch.     PHYSICAL EXAM   Vitals:   05/09/17 1028  BP: 112/74  Pulse: 67  Weight: 142 lb 8 oz (64.6 kg)  Height: 5\' 5"  (1.651 m)    Not recorded      Body mass index is 23.71 kg/m.  PHYSICAL EXAMNIATION:  Gen: NAD, conversant, well nourised, obese, well groomed                     Cardiovascular: Regular rate rhythm, no peripheral edema, warm, nontender. Eyes: Conjunctivae clear without exudates or hemorrhage Neck: Supple, no carotid bruits. Pulmonary: Clear to auscultation bilaterally   NEUROLOGICAL EXAM:  MENTAL STATUS: Speech:    Speech is normal; fluent and spontaneous with normal comprehension.  Cognition:     Orientation to time, place and person     Normal recent and remote memory     Normal Attention span and concentration     Normal Language, naming, repeating,spontaneous speech     Fund of knowledge   CRANIAL NERVES: CN II: Visual fields are full to confrontation. Fundoscopic exam is normal with sharp discs and no vascular changes. Pupils are round equal and briskly reactive to light. CN III, IV, VI: extraocular movement are normal. No ptosis. CN V: Facial sensation is intact to pinprick in all 3 divisions bilaterally. Corneal responses are intact.  CN VII: Face is symmetric with normal eye closure and smile. CN VIII: Hearing is normal to rubbing fingers CN IX, X: Palate elevates symmetrically. Phonation is normal. CN XI: Head turning and shoulder shrug are intact CN XII: Tongue is midline with normal movements and no atrophy.  MOTOR: There is no pronator drift of out-stretched arms. Muscle bulk and tone are normal. Muscle strength is normal.  REFLEXES: Reflexes are 2+ and symmetric at the biceps, triceps, knees, and ankles. Plantar responses are flexor.  SENSORY: Mildly less dependent decreased to light touch, pinprick, and vibratory sensation to ankle  level  COORDINATION: Rapid alternating movements and fine finger movements are intact. There is no dysmetria on finger-to-nose and heel-knee-shin.    GAIT/STANCE: Posture is normal. Gait is steady with normal steps, base, arm swing, and turning. Heel and toe walking are normal. Tandem gait is normal.  Romberg is absent.   DIAGNOSTIC DATA (LABS, IMAGING, TESTING) - I reviewed patient records, labs, notes, testing and imaging myself where available.   ASSESSMENT AND PLAN  Sherry Guzman is  a 83 y.o. female    Mild length dependent sensory changes, bilateral feet paresthesia  Most consistent with small fiber neuropathy   laboratory evaluations for etiology  Gabapentin 100 mg titrating to 300 mg as needed    Levert Feinstein, M.D. Ph.D.  [facility] [address], [address] [place], [place] [address] Ph: [phone] Fax: [phone]  CC: Dois Davenport, MD

## 2017-05-12 LAB — PROTEIN ELECTROPHORESIS
A/G RATIO SPE: 1.5 (ref 0.7–1.7)
Albumin ELP: 4.1 g/dL (ref 2.9–4.4)
Alpha 1: 0.2 g/dL (ref 0.0–0.4)
Alpha 2: 0.7 g/dL (ref 0.4–1.0)
BETA: 1.2 g/dL (ref 0.7–1.3)
GAMMA GLOBULIN: 0.6 g/dL (ref 0.4–1.8)
GLOBULIN, TOTAL: 2.7 g/dL (ref 2.2–3.9)
Total Protein: 6.8 g/dL (ref 6.0–8.5)

## 2017-05-12 LAB — VITAMIN B12: Vitamin B-12: 1281 pg/mL — ABNORMAL HIGH (ref 232–1245)

## 2017-05-12 LAB — C-REACTIVE PROTEIN: CRP: 0.6 mg/L (ref 0.0–4.9)

## 2017-05-12 LAB — RPR: RPR Ser Ql: NONREACTIVE

## 2017-05-12 LAB — HGB A1C W/O EAG: Hgb A1c MFr Bld: 6.1 % — ABNORMAL HIGH (ref 4.8–5.6)

## 2017-05-12 LAB — SEDIMENTATION RATE: Sed Rate: 4 mm/hr (ref 0–40)

## 2017-05-12 LAB — ANA W/REFLEX IF POSITIVE: Anti Nuclear Antibody(ANA): NEGATIVE

## 2017-05-12 LAB — FOLATE

## 2017-05-15 ENCOUNTER — Telehealth: Payer: Self-pay | Admitting: Neurology

## 2017-05-15 NOTE — Telephone Encounter (Signed)
Francis:  Laboratory evaluation showed mild elevated A1c 6.1, suggest mild elevated glucose, does not fit the diagnostic criteria for diabetes, but she should started diet control, moderate exercise.

## 2017-05-15 NOTE — Telephone Encounter (Signed)
Spoke to patient - she is aware of results.  She is agreeable to monitor her diet and exercise.

## 2017-08-15 ENCOUNTER — Ambulatory Visit: Payer: Medicare Other | Admitting: Neurology

## 2017-09-18 ENCOUNTER — Other Ambulatory Visit: Payer: Self-pay | Admitting: Gastroenterology

## 2017-09-27 ENCOUNTER — Telehealth: Payer: Self-pay | Admitting: Neurology

## 2017-09-27 DIAGNOSIS — R7309 Other abnormal glucose: Secondary | ICD-10-CM | POA: Insufficient documentation

## 2017-09-27 DIAGNOSIS — R7303 Prediabetes: Secondary | ICD-10-CM | POA: Insufficient documentation

## 2017-09-27 NOTE — Telephone Encounter (Signed)
Labwork done in the office on 3/26. Insurance has paid all except for hemoglobin labs. They are requiring the reason for the testing. She has spoken with [facility] on several occassions, she has spoken with our billing dept in the past and was advised to contact [facility]. Today she got a bill that said it will be going to collections. The pt is very frustrated at this point. Please call to help with this matter.

## 2017-09-27 NOTE — Telephone Encounter (Addendum)
I called [facility] [phone]) and spoke to BruleKitty in billing.  Patient received a bill for her Hgb A1c (invoice#[id number]).  They simply needed updated ICD10 codes.  We provided R79.9 (abnormal lab finding) and R73.09 (elevated blood glucose).  These codes should be sufficient for Medicare to provide coverage.  The patient can disregard her current bill and it will be re-filed by [facility].

## 2017-11-14 ENCOUNTER — Ambulatory Visit (INDEPENDENT_AMBULATORY_CARE_PROVIDER_SITE_OTHER): Payer: Medicare Other | Admitting: Neurology

## 2017-11-14 ENCOUNTER — Encounter: Payer: Self-pay | Admitting: Neurology

## 2017-11-14 VITALS — BP 132/68 | HR 64 | Ht 65.0 in | Wt 148.0 lb

## 2017-11-14 DIAGNOSIS — R202 Paresthesia of skin: Secondary | ICD-10-CM | POA: Diagnosis not present

## 2017-11-14 MED ORDER — LIDOCAINE-PRILOCAINE 2.5-2.5 % EX CREA: 1.0000 "application " | TOPICAL_CREAM | CUTANEOUS | 0 refills | Status: DC | PRN

## 2017-11-14 MED ORDER — GABAPENTIN 100 MG PO CAPS: 300.0000 mg | ORAL_CAPSULE | Freq: Every day | ORAL | 4 refills | Status: DC

## 2017-11-14 NOTE — Progress Notes (Signed)
PATIENT: Sherry Guzman DOB: 12-Oct-1934  Chief Complaint  Patient presents with  . Numbness    States her symptoms are well controlled by taking gabapentin 116m, two capsules at bedtime.     HISTORICAL  Sherry Guzman a 83years old female seen in refer by primary care doctor RHayden Rasmussen for evaluation of peripheral neuropathy, initial evaluation was on May 09, 2017.  I reviewed and summarized the referring note, she has history of hyperlipidemia, most recent LDL 81 in December 2018, normal CMP with the exception of mild elevated glucose 116, normal TSH, CBC  She is active all her life, just retired in 2018, still enjoys yard work, since 2017, she began to notice numbness burning tingly sensation on the bottom of her feet, has been persistent since then, at the evening time sitting still trying to go to bed, she felt the urge to move her leg,  She denies bilateral hands paresthesia, no gait abnormality, no bowel bladder incontinence.  UPDATE Nov 14 2017: Lab evaluation in March 2019: Normal on negative protein electrophoresis, folic acid, C-reactive protein, ESR, ANA, RPR, B12 level was more than 1200, A1c was elevated 6.1,  She continue complains of bilateral plantar surface paresthesia,Gabapentin 100 mg 2 tablets before bedtime has been helpful, she remains active, ambulate without any difficulties,  REVIEW OF SYSTEMS: Full 14 system review of systems performed and notable only for as above, rest of the review of the system was negative  ALLERGIES: Allergies  Allergen Reactions  . Dust Mite Extract   . Pollen Extract     HOME MEDICATIONS: Current Outpatient Medications  Medication Sig Dispense Refill  . aspirin 81 MG tablet Take 81 mg by mouth every other day.     . Cholecalciferol (VITAMIN D3) 5000 UNITS TABS Take 1 tablet by mouth daily.     .Marland Kitchengabapentin (NEURONTIN) 100 MG capsule Take 3 capsules (300 mg total) by mouth at bedtime. 90 capsule 11  .  ranitidine (ZANTAC) 300 MG tablet TAKE 1 TABLET AT BEDTIME 60 tablet 0  . rosuvastatin (CRESTOR) 10 MG tablet Take 10 mg by mouth daily.     No current facility-administered medications for this visit.     PAST MEDICAL HISTORY: Past Medical History:  Diagnosis Date  . Allergic rhinitis, cause unspecified   . Anal fissure   . Diverticulosis of colon (without mention of hemorrhage)   . GERD (gastroesophageal reflux disease)   . Hyperlipemia   . Internal hemorrhoids   . Osteoarthritis   . Peripheral neuropathy   . Unspecified asthma(493.90)     PAST SURGICAL HISTORY: Past Surgical History:  Procedure Laterality Date  . BIOPSY BREAST    . HEMORRHOID SURGERY    . NASAL SINUS SURGERY    . TONSILLECTOMY      FAMILY HISTORY: Family History  Problem Relation Age of Onset  . Lung cancer Father   . Heart disease Mother   . Colon cancer Neg Hx   . Colon polyps Neg Hx     SOCIAL HISTORY:  Social History   Socioeconomic History  . Marital status: Married    Spouse name: Not on file  . Number of children: 1  . Years of education: 150 . Highest education level: High school graduate  Occupational History  . Occupation: Retired   SScientific laboratory technician . Financial resource strain: Not on file  . Food insecurity:    Worry: Not on file    Inability:  Not on file  . Transportation needs:    Medical: Not on file    Non-medical: Not on file  Tobacco Use  . Smoking status: Never Smoker  . Smokeless tobacco: Never Used  Substance and Sexual Activity  . Alcohol use: No  . Drug use: No  . Sexual activity: Not on file  Lifestyle  . Physical activity:    Days per week: Not on file    Minutes per session: Not on file  . Stress: Not on file  Relationships  . Social connections:    Talks on phone: Not on file    Gets together: Not on file    Attends religious service: Not on file    Active member of club or organization: Not on file    Attends meetings of clubs or organizations: Not  on file    Relationship status: Not on file  . Intimate partner violence:    Fear of current or ex partner: Not on file    Emotionally abused: Not on file    Physically abused: Not on file    Forced sexual activity: Not on file  Other Topics Concern  . Not on file  Social History Narrative   Married, retired   No alcohol, tobacco or drug use   Right-handed.   4 cups of half/half caffeine of coffee, 1 glass of tea at lunch.     PHYSICAL EXAM   Vitals:   11/14/17 0954  BP: 132/68  Pulse: 64  Weight: 148 lb (67.1 kg)  Height: _0  (1.651 m)    Not recorded      Body mass index is 24.63 kg/m.  PHYSICAL EXAMNIATION:  Gen: NAD, conversant, well nourised, obese, well groomed                     Cardiovascular: Regular rate rhythm, no peripheral edema, warm, nontender. Eyes: Conjunctivae clear without exudates or hemorrhage Neck: Supple, no carotid bruits. Pulmonary: Clear to auscultation bilaterally   NEUROLOGICAL EXAM:  MENTAL STATUS: Speech:    Speech is normal; fluent and spontaneous with normal comprehension.  Cognition:     Orientation to time, place and person     Normal recent and remote memory     Normal Attention span and concentration     Normal Language, naming, repeating,spontaneous speech     Fund of knowledge   CRANIAL NERVES: CN II: Visual fields are full to confrontation. Fundoscopic exam is normal with sharp discs and no vascular changes. Pupils are round equal and briskly reactive to light. CN III, IV, VI: extraocular movement are normal. No ptosis. CN V: Facial sensation is intact to pinprick in all 3 divisions bilaterally. Corneal responses are intact.  CN VII: Face is symmetric with normal eye closure and smile. CN VIII: Hearing is normal to rubbing fingers CN IX, X: Palate elevates symmetrically. Phonation is normal. CN XI: Head turning and shoulder shrug are intact CN XII: Tongue is midline with normal movements and no  atrophy.  MOTOR: There is no pronator drift of out-stretched arms. Muscle bulk and tone are normal. Muscle strength is normal.  REFLEXES: Reflexes are 2+ and symmetric at the biceps, triceps, knees, and ankles. Plantar responses are flexor.  SENSORY: Mildly less dependent decreased to light touch, pinprick, and vibratory sensation to ankle level  COORDINATION: Rapid alternating movements and fine finger movements are intact. There is no dysmetria on finger-to-nose and heel-knee-shin.    GAIT/STANCE: Posture is normal. Gait is  steady with normal steps, base, arm swing, and turning. Heel and toe walking are normal. Tandem gait is normal.  Romberg is absent.   DIAGNOSTIC DATA (LABS, IMAGING, TESTING) - I reviewed patient records, labs, notes, testing and imaging myself where available.   ASSESSMENT AND PLAN  THURLEY FRANCESCONI is a 83 y.o. female    Mild length dependent sensory changes, bilateral feet paresthesia  Most consistent with small fiber neuropathy   laboratory evaluations showed elevated A1c 6.1,  Gabapentin 100 mg titrating to 300 mg every night as needed  Also add on Emla cream as needed.    Marcial Pacas, M.D. Ph.D.  [facility] [address], [place], [place] [address] Ph: [phone] Fax: [phone]  CC: Hayden Rasmussen, MD

## 2017-11-15 ENCOUNTER — Telehealth: Payer: Self-pay | Admitting: Gastroenterology

## 2017-11-15 ENCOUNTER — Encounter: Payer: Self-pay | Admitting: Gastroenterology

## 2017-11-15 ENCOUNTER — Ambulatory Visit (INDEPENDENT_AMBULATORY_CARE_PROVIDER_SITE_OTHER): Payer: Medicare Other | Admitting: Gastroenterology

## 2017-11-15 VITALS — BP 120/72 | HR 80 | Ht 64.17 in | Wt 147.5 lb

## 2017-11-15 DIAGNOSIS — K219 Gastro-esophageal reflux disease without esophagitis: Secondary | ICD-10-CM | POA: Diagnosis not present

## 2017-11-15 MED ORDER — FAMOTIDINE 40 MG PO TABS: 80.0000 mg | ORAL_TABLET | Freq: Every day | ORAL | 3 refills | Status: DC

## 2017-11-15 NOTE — Patient Instructions (Addendum)
Switch to famotidine (80mg  total at bedtime nightly, disp 90 days with 3 refills). Call in one month to report on your response.  We have sent the following medications to your pharmacy for you to pick up at your convenience:  Normal BMI (Body Mass Index- based on height and weight) is between 23 and 30. Your BMI today is Body mass index is 25.18 kg/m. Marland Kitchen Please consider follow up  regarding your BMI with your Primary Care Provider.  Thank you for entrusting me with your care and choosing [facility].  Dr Christella Hartigan

## 2017-11-15 NOTE — Progress Notes (Signed)
Review of gastrointestinal problems:  1. Routine risk for colon cancer, last colonoscopy with Dr. Corinda Gubler 2004 showed diverticulosis but no polyps. Next colonoscopy February 2014. Colonoscopy 05/2012 was tortuous but no polyps.  NO recommended recall given age 83. GERD: intermittent chest, indigestion symptoms. Improved with H2 blocker. Reflux documented on 2010 upper GI 3. Coccydyndia.   HPI: This is a very pleasant 83 year old woman whom I last saw 1 2 years ago.  Chief complaint is GERD  She has significant water brash unless she takes 300 mg of ranitidine at bedtime every night.  On that medicine she does perfectly fine.  She has no dysphasia, no weight loss.  She is concerned about recent reports that ranitidine may contain a cancer causing compound.  ROS: complete GI ROS as described in HPI, all other review negative.  Constitutional:  No unintentional weight loss   Past Medical History:  Diagnosis Date  . Allergic rhinitis, cause unspecified   . Anal fissure   . Diverticulosis of colon (without mention of hemorrhage)   . GERD (gastroesophageal reflux disease)   . Hyperlipemia   . Internal hemorrhoids   . Osteoarthritis   . Peripheral neuropathy   . Unspecified asthma(493.90)     Past Surgical History:  Procedure Laterality Date  . BIOPSY BREAST    . HEMORRHOID SURGERY    . NASAL SINUS SURGERY    . TONSILLECTOMY      Current Outpatient Medications  Medication Sig Dispense Refill  . aspirin 81 MG tablet Take 81 mg by mouth every other day.     . Cholecalciferol (VITAMIN D3) 5000 UNITS TABS Take 1 tablet by mouth daily.     Marland Kitchen gabapentin (NEURONTIN) 100 MG capsule Take 3 capsules (300 mg total) by mouth at bedtime. 270 capsule 4  . lidocaine-prilocaine (EMLA) cream Apply 1 application topically as needed. 30 g 0  . ranitidine (ZANTAC) 300 MG tablet TAKE 1 TABLET AT BEDTIME 60 tablet 0  . rosuvastatin (CRESTOR) 10 MG tablet Take 10 mg by mouth daily.     No current  facility-administered medications for this visit.     Allergies as of 11/15/2017 - Review Complete 11/15/2017  Allergen Reaction Noted  . Dust mite extract  07/01/2014  . Pollen extract  07/01/2014    Family History  Problem Relation Age of Onset  . Lung cancer Father   . Heart disease Mother   . Colon cancer Neg Hx   . Colon polyps Neg Hx     Social History   Socioeconomic History  . Marital status: Married    Spouse name: Not on file  . Number of children: 1  . Years of education: 14  . Highest education level: High school graduate  Occupational History  . Occupation: Retired   Engineer, production  . Financial resource strain: Not on file  . Food insecurity:    Worry: Not on file    Inability: Not on file  . Transportation needs:    Medical: Not on file    Non-medical: Not on file  Tobacco Use  . Smoking status: Never Smoker  . Smokeless tobacco: Never Used  Substance and Sexual Activity  . Alcohol use: No  . Drug use: No  . Sexual activity: Not on file  Lifestyle  . Physical activity:    Days per week: Not on file    Minutes per session: Not on file  . Stress: Not on file  Relationships  . Social connections:  Talks on phone: Not on file    Gets together: Not on file    Attends religious service: Not on file    Active member of club or organization: Not on file    Attends meetings of clubs or organizations: Not on file    Relationship status: Not on file  . Intimate partner violence:    Fear of current or ex partner: Not on file    Emotionally abused: Not on file    Physically abused: Not on file    Forced sexual activity: Not on file  Other Topics Concern  . Not on file  Social History Narrative   Married, retired   No alcohol, tobacco or drug use   Right-handed.   4 cups of half/half caffeine of coffee, 1 glass of tea at lunch.     Physical Exam: BP 120/72 (BP Location: Left Arm, Patient Position: Sitting, Cuff Size: Normal)   Pulse 80   Ht 5'  4.17" (1.63 m) Comment: height measured without shoes  Wt 147 lb 8 oz (66.9 kg)   BMI 25.18 kg/m  Constitutional: generally well-appearing Psychiatric: alert and oriented x3 Abdomen: soft, nontender, nondistended, no obvious ascites, no peritoneal signs, normal bowel sounds No peripheral edema noted in lower extremities  Assessment and plan: 83 y.o. female with well-controlled GERD on H2 blocker at bedtime every night  We discussed other options for her GERD control.  She is going to switch to famotidine 40 mg pills, she will take 2 of them at bedtime every night and she will call back to report on her response in 3 to 4 weeks.  Please see the "Patient Instructions" section for addition details about the plan.  Rob Bunting, MD [facility] 11/15/2017, 10:57 AM

## 2017-11-15 NOTE — Telephone Encounter (Signed)
Can you please call her.  I was mistaken about dosage. Pepcid(famotidine) 40mg  pill is equivalent to zantac(ranitidine) 300mg  pill.   I told her 80mg  was equivalent in error.  Can you offer her a new prescription for 40mg  famotidine pills, one pill at bedtime nightly, refills for a year and change her med list appropriately.  Thanks

## 2017-11-16 ENCOUNTER — Other Ambulatory Visit: Payer: Self-pay | Admitting: Gastroenterology

## 2017-11-16 MED ORDER — FAMOTIDINE 40 MG PO TABS: 40.0000 mg | ORAL_TABLET | Freq: Every day | ORAL | 3 refills | Status: DC

## 2017-11-16 NOTE — Telephone Encounter (Signed)
Notified patient and [facility]. New order sent for Famotidine 40mg  daily at bedtime #90 with 3 refills.

## 2017-11-17 ENCOUNTER — Other Ambulatory Visit: Payer: Self-pay | Admitting: Gastroenterology

## 2018-10-24 ENCOUNTER — Other Ambulatory Visit: Payer: Self-pay | Admitting: Gastroenterology

## 2018-12-31 ENCOUNTER — Other Ambulatory Visit: Payer: Self-pay | Admitting: Neurology

## 2020-08-21 ENCOUNTER — Ambulatory Visit
Admission: RE | Admit: 2020-08-21 | Discharge: 2020-08-21 | Disposition: A | Discharge status: Home / Self Care | Payer: Medicare Other | Source: Ambulatory Visit | Attending: Family Medicine | Admitting: Family Medicine

## 2020-08-21 ENCOUNTER — Other Ambulatory Visit: Payer: Self-pay | Admitting: Family Medicine

## 2020-08-21 ENCOUNTER — Other Ambulatory Visit: Payer: Self-pay

## 2020-08-21 DIAGNOSIS — M5416 Radiculopathy, lumbar region: Secondary | ICD-10-CM

## 2020-09-11 ENCOUNTER — Other Ambulatory Visit: Payer: Self-pay | Admitting: Physical Medicine and Rehabilitation

## 2020-09-11 DIAGNOSIS — M25511 Pain in right shoulder: Secondary | ICD-10-CM

## 2020-09-28 ENCOUNTER — Ambulatory Visit
Admission: RE | Admit: 2020-09-28 | Discharge: 2020-09-28 | Disposition: A | Discharge status: Home / Self Care | Payer: Medicare Other | Source: Ambulatory Visit | Attending: Physical Medicine and Rehabilitation | Admitting: Physical Medicine and Rehabilitation

## 2020-09-28 DIAGNOSIS — M25511 Pain in right shoulder: Secondary | ICD-10-CM

## 2021-09-03 ENCOUNTER — Telehealth: Payer: Self-pay | Admitting: Family Medicine

## 2021-09-03 NOTE — Telephone Encounter (Signed)
Pt states she was a previous pt of Dr Mardelle Matte at another location. She is asking if Dr Mardelle Matte would accept her as a new patient. Please advise

## 2022-01-25 LAB — HM DEXA SCAN

## 2022-01-25 LAB — HM MAMMOGRAPHY

## 2022-02-01 ENCOUNTER — Ambulatory Visit (INDEPENDENT_AMBULATORY_CARE_PROVIDER_SITE_OTHER): Payer: Medicare Other | Admitting: Family Medicine

## 2022-02-01 ENCOUNTER — Encounter: Payer: Self-pay | Admitting: Family Medicine

## 2022-02-01 VITALS — BP 113/70 | HR 68 | Temp 97.2°F | Ht 64.5 in | Wt 140.8 lb

## 2022-02-01 DIAGNOSIS — I10 Essential (primary) hypertension: Secondary | ICD-10-CM | POA: Insufficient documentation

## 2022-02-01 DIAGNOSIS — E782 Mixed hyperlipidemia: Secondary | ICD-10-CM | POA: Insufficient documentation

## 2022-02-01 DIAGNOSIS — K5730 Diverticulosis of large intestine without perforation or abscess without bleeding: Secondary | ICD-10-CM

## 2022-02-01 DIAGNOSIS — R7309 Other abnormal glucose: Secondary | ICD-10-CM

## 2022-02-01 DIAGNOSIS — K219 Gastro-esophageal reflux disease without esophagitis: Secondary | ICD-10-CM

## 2022-02-01 DIAGNOSIS — G609 Hereditary and idiopathic neuropathy, unspecified: Secondary | ICD-10-CM | POA: Insufficient documentation

## 2022-02-01 DIAGNOSIS — J309 Allergic rhinitis, unspecified: Secondary | ICD-10-CM

## 2022-02-01 DIAGNOSIS — M47816 Spondylosis without myelopathy or radiculopathy, lumbar region: Secondary | ICD-10-CM

## 2022-02-01 DIAGNOSIS — M810 Age-related osteoporosis without current pathological fracture: Secondary | ICD-10-CM

## 2022-02-01 DIAGNOSIS — Z0000 Encounter for general adult medical examination without abnormal findings: Secondary | ICD-10-CM

## 2022-02-01 LAB — CBC WITH DIFFERENTIAL/PLATELET
Basophils Absolute: 0.1 10*3/uL (ref 0.0–0.1)
Basophils Relative: 0.9 % (ref 0.0–3.0)
Eosinophils Absolute: 0.2 10*3/uL (ref 0.0–0.7)
Eosinophils Relative: 3.3 % (ref 0.0–5.0)
HCT: 39.2 % (ref 36.0–46.0)
Hemoglobin: 13.8 g/dL (ref 12.0–15.0)
Lymphocytes Relative: 32.7 % (ref 12.0–46.0)
Lymphs Abs: 1.8 10*3/uL (ref 0.7–4.0)
MCHC: 35.2 g/dL (ref 30.0–36.0)
MCV: 90.7 fl (ref 78.0–100.0)
Monocytes Absolute: 0.7 10*3/uL (ref 0.1–1.0)
Monocytes Relative: 11.8 % (ref 3.0–12.0)
Neutro Abs: 2.9 10*3/uL (ref 1.4–7.7)
Neutrophils Relative %: 51.3 % (ref 43.0–77.0)
Platelets: 257 10*3/uL (ref 150.0–400.0)
RBC: 4.33 Mil/uL (ref 3.87–5.11)
RDW: 14.2 % (ref 11.5–15.5)
WBC: 5.6 10*3/uL (ref 4.0–10.5)

## 2022-02-01 LAB — LIPID PANEL
Cholesterol: 182 mg/dL (ref 0–200)
HDL: 78.5 mg/dL (ref >39.00)
LDL Cholesterol: 82 mg/dL (ref 0–99)
NonHDL: 103.68
Total CHOL/HDL Ratio: 2
Triglycerides: 109 mg/dL (ref 0.0–149.0)
VLDL: 21.8 mg/dL (ref 0.0–40.0)

## 2022-02-01 LAB — COMPREHENSIVE METABOLIC PANEL
ALT: 13 U/L (ref 0–35)
AST: 20 U/L (ref 0–37)
Albumin: 4.7 g/dL (ref 3.5–5.2)
Alkaline Phosphatase: 62 U/L (ref 39–117)
BUN: 15 mg/dL (ref 6–23)
CO2: 29 mEq/L (ref 19–32)
Calcium: 10.4 mg/dL (ref 8.4–10.5)
Chloride: 100 mEq/L (ref 96–112)
Creatinine, Ser: 0.98 mg/dL (ref 0.40–1.20)
GFR: 52.62 mL/min — ABNORMAL LOW (ref >60.00)
Glucose, Bld: 100 mg/dL — ABNORMAL HIGH (ref 70–99)
Potassium: 4.9 mEq/L (ref 3.5–5.1)
Sodium: 135 mEq/L (ref 135–145)
Total Bilirubin: 0.4 mg/dL (ref 0.2–1.2)
Total Protein: 7.2 g/dL (ref 6.0–8.3)

## 2022-02-01 LAB — HEMOGLOBIN A1C: Hgb A1c MFr Bld: 6.2 % (ref 4.6–6.5)

## 2022-02-01 LAB — TSH: TSH: 3.08 u[IU]/mL (ref 0.35–5.50)

## 2022-02-01 MED ORDER — LISINOPRIL 5 MG PO TABS: 5.0000 mg | ORAL_TABLET | Freq: Every day | ORAL | 3 refills | Status: DC

## 2022-02-01 MED ORDER — ALENDRONATE SODIUM 70 MG PO TABS: 70.0000 mg | ORAL_TABLET | ORAL | 3 refills | Status: DC

## 2022-02-01 MED ORDER — GABAPENTIN 300 MG PO CAPS: 300.0000 mg | ORAL_CAPSULE | Freq: Two times a day (BID) | ORAL | 3 refills | Status: DC

## 2022-02-01 MED ORDER — ALPRAZOLAM 0.25 MG PO TABS: 0.2500 mg | ORAL_TABLET | Freq: Every day | ORAL | 0 refills | Status: DC | PRN

## 2022-02-01 NOTE — Patient Instructions (Signed)
It was so good seeing you again! Thank you for establishing with my new practice and allowing me to continue caring for you. It means a lot to me.   Please schedule a follow up appointment with me in 12 months for your annual complete physical; please come fasting.   We will call you with your lab results.   YOU are amazing!

## 2022-02-01 NOTE — Progress Notes (Signed)
Subjective  CC:  Chief Complaint  Patient presents with   New Patient (Initial Visit)    Pt doesn't have any questions or concerns     HPI: Sherry Guzman is a 87 y.o. female is a former [facility] patient (many years ago, has seen Dr. Nadyne Coombes in the interim) and is here to reestablish care with me today.   Reviewed records since 2014; reviewed mammogram 2023 and dexa 2023 and 2021 and updated PL with results. Reviewed GI consults She has the following concerns or needs: annual exam and f/u of chronic condition"  87-year currently active and healthy female here to establish care.  I took care of her from 2014-2017 at my prior practice.  Reviewed those notes.  Fortunately, she has continued to do well.  She exercises regularly, does lots of yard work including mowing for washings throughout the summer months.  She lives alone.  She has no mood or cognitive dysfunction.  She has chronic allergies, history of GERD.  Neither are currently bothersome Health maintenance: All screens are current.  Doing well.  Reviewed normal mammogram. Hyperlipidemic on statin for a number of years.  Tolerates well.  Eats healthy diet.  Normal BMI Started on lisinopril 5 mg a few years ago for mild hypertension.  Tolerates well.  Blood pressure remains in a very normal range.  No kidney dysfunction. Chart shows history of elevated A1c but no symptoms of hypoglycemia. Reviewed lumbar x-ray showing mild degenerative disc disease on the low spine.  This really bothers her Her main health concern is idiopathic peripheral neuropathy evaluated by neurology in the past.  This has been controlled with gabapentin 300 mg daily.  However symptoms are more bothersome during the day now.  She has not tried titrating the dose up.  No foot sores. Does have a reticulosis, did have a bout of diverticulitis.  Possibly has IBS Osteoporosis: Most recent bone density shows T equals -2.8 in the lumbar spine.  This is a new  diagnosis.  Has been following osteopenia for several years.  Had been on Actonel over 10 years ago.  Takes calcium and vitamin D.  As noted above, does lots of weightbearing exercises.  No history of fractures  Assessment  1. Annual physical exam   2. Chronic allergic rhinitis   3. Gastroesophageal reflux disease, unspecified whether esophagitis present   4. Osteoporosis of lumbar spine   5. Mixed hyperlipidemia   6. Essential hypertension   7. Elevated hemoglobin A1c   8. Spondylosis of lumbar region without myelopathy or radiculopathy   9. Idiopathic peripheral neuropathy   10. Diverticulosis of colon      Plan  Hypertension is well-controlled on lisinopril 5 mg daily.  Recheck electrolytes and renal function. Allergies and GERD are stable.  Supportively cared for Osteoporosis: Discussed new diagnosis.  Will start Fosamax, discussed appropriate use, education, risks and benefits.  Will recheck bone density in 2 years Continue Lipitor 10 mg daily.  Recheck lipids and LFTs today.  Reports good control. Idiopathic peripheral neuropathy: Will titrate gabapentin up to 300 mg twice daily.  Can consider going up to 3 times daily dosing if needed.  Education given. Recheck A1c Annual physical exam: Health maintenance is current.  Routine guidance on diet and nutrition.  Continue daily exercise.  Eye exam is current.  No mood or cognitive dysfunction noted.  She is doing extremely well.  I spent a total of 32 minutes in addition to the annual wellness visit  for this patient encounter. Time spent included preparation, face-to-face counseling with the patient and coordination of care, review of chart and records, and documentation of the encounter.  Follow up: 12 months for complete physical  Orders Placed This Encounter  Procedures   CBC with Differential/Platelet   Comprehensive metabolic panel   Lipid panel   Hemoglobin A1c   TSH   Meds ordered this encounter  Medications    ALPRAZolam (XANAX) 0.25 MG tablet    Sig: Take 1 tablet (0.25 mg total) by mouth daily as needed for anxiety.    Dispense:  20 tablet    Refill:  0   gabapentin (NEURONTIN) 300 MG capsule    Sig: Take 1 capsule (300 mg total) by mouth 2 (two) times daily.    Dispense:  180 capsule    Refill:  3   lisinopril (ZESTRIL) 5 MG tablet    Sig: Take 1 tablet (5 mg total) by mouth daily.    Dispense:  90 tablet    Refill:  3   alendronate (FOSAMAX) 70 MG tablet    Sig: Take 1 tablet (70 mg total) by mouth every 7 (seven) days. Take with a full glass of water on an empty stomach.    Dispense:  12 tablet    Refill:  3      We updated and reviewed the patient's past history in detail and it is documented below.  Patient Active Problem List   Diagnosis Date Noted   Mixed hyperlipidemia 02/01/2022    Priority: High   Essential hypertension 02/01/2022    Priority: High   Spondylosis of lumbar region without myelopathy or radiculopathy 02/01/2022    Priority: Medium    Osteoporosis of lumbar spine 11/08/2011    Priority: Medium     DEXA 10/2011; h/o actonel.  11/2013: DEXA T=-2.2, [facility]. Continue vit D and Ca. Recheck 2 years. Start meds if declining. Pt agrees.  DEXA 12/2015 = T=-2.6 at L/S. Better at other sites. rec restart meds DEXA 01/2022: lowest T= 2.8 lumbar, osteopenia at other sites. Start fosamax [facility]    GERD 08/01/2008    Priority: Medium    Chronic allergic rhinitis 03/06/2007    Priority: Low   Idiopathic peripheral neuropathy 02/01/2022   Diverticulosis of colon 02/01/2022   Elevated hemoglobin A1c 09/27/2017   Dry eye syndrome 10/05/2012   Health Maintenance  Topic Date Due   COVID-19 Vaccine (1) Never done   Zoster Vaccines- Shingrix (2 of 2) 09/01/2016   Medicare Annual Wellness (AWV)  06/13/2017   INFLUENZA VACCINE  09/14/2021   DEXA SCAN  01/26/2024   DTaP/Tdap/Td (3 - Td or Tdap) 12/01/2027   Pneumonia Vaccine 55+ Years old  Completed   HPV VACCINES  Aged Out    Immunization History  Administered Date(s) Administered   Influenza, High Dose Seasonal PF 11/08/2011, 11/11/2014, 11/11/2014   Influenza,trivalent, recombinat, inj, PF 02/14/2009   Influenza-Unspecified 12/19/2013   Pneumococcal Conjugate-13 12/19/2013   Pneumococcal Polysaccharide-23 02/14/2002, 11/08/2011   Tdap 03/17/2008, 11/30/2017   Zoster Recombinat (Shingrix) 07/07/2016   Zoster, Live 02/14/2005, 05/07/2016   Current Meds  Medication Sig   alendronate (FOSAMAX) 70 MG tablet Take 1 tablet (70 mg total) by mouth every 7 (seven) days. Take with a full glass of water on an empty stomach.   Cholecalciferol (VITAMIN D3) 5000 UNITS TABS Take 1 tablet by mouth daily.    rosuvastatin (CRESTOR) 10 MG tablet Take 10 mg by mouth daily.   [DISCONTINUED] ALPRAZolam (  XANAX) 0.25 MG tablet Take by mouth.   [DISCONTINUED] gabapentin (NEURONTIN) 100 MG capsule Take 3 capsules at bedtime.  Please request future refills from PCP.   [DISCONTINUED] lisinopril (ZESTRIL) 5 MG tablet Take 5 mg by mouth daily.    Allergies: Patient is allergic to dust mite extract and pollen extract. Past Medical History Patient  has a past medical history of Allergic rhinitis, cause unspecified, Anal fissure, Diverticulosis of colon (without mention of hemorrhage), GERD (gastroesophageal reflux disease), Hyperlipemia, Internal hemorrhoids, Osteoarthritis, and Peripheral neuropathy. Past Surgical History Patient  has a past surgical history that includes Nasal sinus surgery; Hemorrhoid surgery; Biopsy breast; and Tonsillectomy. Family History: Patient family history includes Heart disease in her mother; Lung cancer in her father. Social History:  Patient  reports that she has never smoked. She has never used smokeless tobacco. She reports that she does not drink alcohol and does not use drugs.  Review of Systems: Constitutional: negative for fever or malaise Ophthalmic: negative for photophobia, double vision or  loss of vision Cardiovascular: negative for chest pain, dyspnea on exertion, or new LE swelling Respiratory: negative for SOB or persistent cough Gastrointestinal: negative for abdominal pain, change in bowel habits or melena Genitourinary: negative for dysuria or gross hematuria Musculoskeletal: negative for new gait disturbance or muscular weakness Integumentary: negative for new or persistent rashes Neurological: negative for TIA or stroke symptoms Psychiatric: negative for SI or delusions Allergic/Immunologic: negative for hives  Patient Care Team    Relationship Specialty Notifications Start End  Willow Ora, MD PCP - General Family Medicine  02/01/22   Rachael Fee, MD Consulting Physician Gastroenterology  02/01/22   Elmon Else, MD Consulting Physician Dermatology  02/01/22     Objective  Vitals: BP 113/70 (BP Location: Right Arm, Patient Position: Sitting)   Pulse 68   Temp (!) 97.2 F (36.2 C) (Temporal)   Ht 5' 4.5" (1.638 m)   Wt 140 lb 12.8 oz (63.9 kg)   SpO2 98%   BMI 23.80 kg/m  General:  Well developed, well nourished, no acute distress, appears much younger than stated age Psych:  Alert and oriented,normal mood and affect HEENT:  Normocephalic, atraumatic, non-icteric sclera, PERRL, oropharynx is without mass or exudate, supple neck without adenopathy, mass or thyromegaly Cardiovascular:  RRR without gallop, rub or murmur, nondisplaced PMI, no edema Respiratory:  Good breath sounds bilaterally, CTAB with normal respiratory effort Gastrointestinal: normal bowel sounds, soft, non-tender, no noted masses. No HSM MSK: no deformities, contusions. Joints are without erythema or swelling Skin:  Warm, no rashes or suspicious lesions noted Neurologic:    Mental status is normal. Gross motor and sensory exams are normal. Normal gait, no tremor Commons side effects, risks, benefits, and alternatives for medications and treatment plan prescribed today were  discussed, and the patient expressed understanding of the given instructions. Patient is instructed to call or message via MyChart if he/she has any questions or concerns regarding our treatment plan. No barriers to understanding were identified. We discussed Red Flag symptoms and signs in detail. Patient expressed understanding regarding what to do in case of urgent or emergency type symptoms.  Medication list was reconciled, printed and provided to the patient in AVS. Patient instructions and summary information was reviewed with the patient as documented in the AVS. This note was prepared with assistance of Dragon voice recognition software. Occasional wrong-word or sound-a-like substitutions may have occurred due to the inherent limitations of voice recognition software

## 2022-02-03 ENCOUNTER — Encounter: Payer: Self-pay | Admitting: Family Medicine

## 2022-02-04 ENCOUNTER — Ambulatory Visit: Payer: Medicare Other | Admitting: Family Medicine

## 2022-02-08 ENCOUNTER — Telehealth: Payer: Self-pay | Admitting: Family Medicine

## 2022-02-08 NOTE — Telephone Encounter (Signed)
  LAST APPOINTMENT DATE:  02/01/22  NEXT APPOINTMENT DATE: 02/06/23  MEDICATION: rosuvastatin (CRESTOR) 10 MG tablet   Is the patient out of medication? Has a few pills left  PHARMACY: [facility] - [place], [place] - [address] [place] [address] Phone: [phone]  Fax: [phone]    Patient states: - PCP stated she would send medication on 02/01/22 but she didn't see it in with the other meds  - Needs this as soon as possible due to being cholesterol medication

## 2022-02-09 ENCOUNTER — Other Ambulatory Visit: Payer: Self-pay

## 2022-02-09 MED ORDER — ROSUVASTATIN CALCIUM 10 MG PO TABS: 10.0000 mg | ORAL_TABLET | Freq: Every day | ORAL | 3 refills | Status: DC

## 2022-02-09 NOTE — Telephone Encounter (Signed)
Refill sent to [facility].

## 2022-03-01 ENCOUNTER — Encounter: Payer: Self-pay | Admitting: Family Medicine

## 2022-03-01 ENCOUNTER — Telehealth: Payer: Self-pay | Admitting: Family Medicine

## 2022-03-01 NOTE — Telephone Encounter (Signed)
Pt would like a call back with biopsy results

## 2022-03-02 NOTE — Telephone Encounter (Signed)
Pt has been informed that she will need to contact the dermatology office to obtain her biopsy results

## 2022-05-23 ENCOUNTER — Telehealth: Payer: Self-pay | Admitting: Family Medicine

## 2022-05-23 NOTE — Telephone Encounter (Signed)
Contacted [facility] to schedule their annual wellness visit. Patient declined to schedule AWV at this time.  Per patient she had with [employer].  Gabriel Cirri [facility] AWV TEAM Direct Dial [phone]

## 2022-08-22 ENCOUNTER — Ambulatory Visit (INDEPENDENT_AMBULATORY_CARE_PROVIDER_SITE_OTHER): Payer: Medicare Other | Admitting: Physician Assistant

## 2022-08-22 ENCOUNTER — Encounter: Payer: Self-pay | Admitting: Physician Assistant

## 2022-08-22 VITALS — BP 110/66 | HR 62 | Temp 98.2°F | Ht 64.5 in | Wt 144.2 lb

## 2022-08-22 DIAGNOSIS — M79675 Pain in left toe(s): Secondary | ICD-10-CM

## 2022-08-22 NOTE — Progress Notes (Signed)
Sherry Guzman is a 88 y.o. female here for a new problem.  History of Present Illness:   Chief Complaint  Patient presents with   Toe Pain    Pt c/o pain and redness left great toe started on Saturday. Has been using Aleve with relief.   Pain in left great toe: She complains of swelling, redness, and pain on her left great toe since Saturday, 08/20/2022.  She had difficulty walking due to her pain on Saturday and Sunday.  She denies fever, chills, redness going up her leg.  She is taking aleve 1x every 12 hours PO to manage her symptoms and found relief.  She had no change to her daily routine and denies any prior injury to her feet.  She has no recent changes to her medication list.  She rarely drinks wine.  Most her meat consumption is deli meats.  Her symptoms have improved this afternoon.  She has a history of arthritis in her hands.     Past Medical History:  Diagnosis Date   Allergic rhinitis, cause unspecified    Anal fissure    Diverticulosis of colon (without mention of hemorrhage)    GERD (gastroesophageal reflux disease)    Hyperlipemia    Internal hemorrhoids    Osteoarthritis    Peripheral neuropathy      Social History   Tobacco Use   Smoking status: Never   Smokeless tobacco: Never  Vaping Use   Vaping Use: Never used  Substance Use Topics   Alcohol use: No   Drug use: No    Past Surgical History:  Procedure Laterality Date   BIOPSY BREAST     HEMORRHOID SURGERY     NASAL SINUS SURGERY     TONSILLECTOMY      Family History  Problem Relation Age of Onset   Lung cancer Father    Heart disease Mother    Colon cancer Neg Hx    Colon polyps Neg Hx     Allergies  Allergen Reactions   Dust Mite Extract    Pollen Extract     Current Medications:   Current Outpatient Medications:    alendronate (FOSAMAX) 70 MG tablet, Take 1 tablet (70 mg total) by mouth every 7 (seven) days. Take with a full glass of water on an empty stomach., Disp:  12 tablet, Rfl: 3   ALPRAZolam (XANAX) 0.25 MG tablet, Take 1 tablet (0.25 mg total) by mouth daily as needed for anxiety., Disp: 20 tablet, Rfl: 0   Cholecalciferol (VITAMIN D3) 5000 UNITS TABS, Take 1 tablet by mouth daily. , Disp: , Rfl:    gabapentin (NEURONTIN) 300 MG capsule, Take 1 capsule (300 mg total) by mouth 2 (two) times daily., Disp: 180 capsule, Rfl: 3   lisinopril (ZESTRIL) 5 MG tablet, Take 1 tablet (5 mg total) by mouth daily., Disp: 90 tablet, Rfl: 3   rosuvastatin (CRESTOR) 10 MG tablet, Take 1 tablet (10 mg total) by mouth daily., Disp: 90 tablet, Rfl: 3   Review of Systems:   Review of Systems  Constitutional:  Negative for chills and fever.  Musculoskeletal:        (+)left great toe tenderness (+)swelling in left great toe  Skin:        (+)redness around left great toe (-)redness streaking up leg    Vitals:   Vitals:   08/22/22 1525  BP: 110/66  Pulse: 62  Temp: 98.2 F (36.8 C)  TempSrc: Temporal  SpO2: 99%  Weight:  144 lb 4 oz (65.4 kg)  Height: 5' 4.5" (1.638 m)     Body mass index is 24.38 kg/m.  Physical Exam:   Physical Exam Constitutional:      Appearance: Normal appearance. She is well-developed.  HENT:     Head: Normocephalic and atraumatic.  Eyes:     General: Lids are normal.     Extraocular Movements: Extraocular movements intact.     Conjunctiva/sclera: Conjunctivae normal.  Cardiovascular:     Pulses:          Posterior tibial pulses are 2+ on the right side and 2+ on the left side.  Pulmonary:     Effort: Pulmonary effort is normal.  Musculoskeletal:        General: Normal range of motion.     Cervical back: Normal range of motion and neck supple.     Comments: Left metatarsophalangeal joint with slight erythema and tenderness to palpation No induration/fluctuance Normal ROM  Skin:    General: Skin is warm and dry.  Neurological:     Mental Status: She is alert and oriented to person, place, and time.  Psychiatric:         Attention and Perception: Attention and perception normal.        Mood and Affect: Mood normal.        Behavior: Behavior normal.        Thought Content: Thought content normal.        Judgment: Judgment normal.     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  I, Jarold Motto, [place], have reviewed all documentation for this visit. The documentation on 08/22/22 for the exam, diagnosis, procedures, and orders are all accurate and complete.  Jarold Motto, PA-C

## 2022-08-22 NOTE — Patient Instructions (Signed)
It was great to see you!  We will get blood work today to further evaluate Keep me posted on your symptoms!  Take care,  Jarold Motto PA-C

## 2022-08-23 LAB — CBC WITH DIFFERENTIAL/PLATELET
Basophils Absolute: 0.1 10*3/uL (ref 0.0–0.1)
Basophils Relative: 1.2 % (ref 0.0–3.0)
Eosinophils Absolute: 0.1 10*3/uL (ref 0.0–0.7)
Eosinophils Relative: 2 % (ref 0.0–5.0)
HCT: 35.9 % — ABNORMAL LOW (ref 36.0–46.0)
Hemoglobin: 12 g/dL (ref 12.0–15.0)
Lymphocytes Relative: 30.3 % (ref 12.0–46.0)
Lymphs Abs: 1.9 10*3/uL (ref 0.7–4.0)
MCHC: 33.4 g/dL (ref 30.0–36.0)
MCV: 90.7 fl (ref 78.0–100.0)
Monocytes Absolute: 0.8 10*3/uL (ref 0.1–1.0)
Monocytes Relative: 12.4 % — ABNORMAL HIGH (ref 3.0–12.0)
Neutro Abs: 3.4 10*3/uL (ref 1.4–7.7)
Neutrophils Relative %: 54.1 % (ref 43.0–77.0)
Platelets: 250 10*3/uL (ref 150.0–400.0)
RBC: 3.95 Mil/uL (ref 3.87–5.11)
RDW: 13.9 % (ref 11.5–15.5)
WBC: 6.3 10*3/uL (ref 4.0–10.5)

## 2022-08-23 LAB — COMPREHENSIVE METABOLIC PANEL
ALT: 10 U/L (ref 0–35)
AST: 19 U/L (ref 0–37)
Albumin: 4.3 g/dL (ref 3.5–5.2)
Alkaline Phosphatase: 56 U/L (ref 39–117)
BUN: 14 mg/dL (ref 6–23)
CO2: 26 mEq/L (ref 19–32)
Calcium: 9.7 mg/dL (ref 8.4–10.5)
Chloride: 101 mEq/L (ref 96–112)
Creatinine, Ser: 1.11 mg/dL (ref 0.40–1.20)
GFR: 45.14 mL/min — ABNORMAL LOW (ref >60.00)
Glucose, Bld: 160 mg/dL — ABNORMAL HIGH (ref 70–99)
Potassium: 4.6 mEq/L (ref 3.5–5.1)
Sodium: 132 mEq/L — ABNORMAL LOW (ref 135–145)
Total Bilirubin: 0.3 mg/dL (ref 0.2–1.2)
Total Protein: 6.6 g/dL (ref 6.0–8.3)

## 2022-08-23 LAB — URIC ACID: Uric Acid, Serum: 4.7 mg/dL (ref 2.4–7.0)

## 2022-10-13 ENCOUNTER — Ambulatory Visit (INDEPENDENT_AMBULATORY_CARE_PROVIDER_SITE_OTHER): Payer: Medicare Other | Admitting: Family Medicine

## 2022-10-13 ENCOUNTER — Encounter: Payer: Self-pay | Admitting: Family Medicine

## 2022-10-13 VITALS — BP 120/72 | HR 62 | Temp 98.6°F | Ht 64.5 in | Wt 142.2 lb

## 2022-10-13 DIAGNOSIS — J309 Allergic rhinitis, unspecified: Secondary | ICD-10-CM | POA: Diagnosis not present

## 2022-10-13 DIAGNOSIS — Z7185 Encounter for immunization safety counseling: Secondary | ICD-10-CM | POA: Diagnosis not present

## 2022-10-13 DIAGNOSIS — I10 Essential (primary) hypertension: Secondary | ICD-10-CM

## 2022-10-13 MED ORDER — SHINGRIX 50 MCG/0.5ML IM SUSR: 0.5000 mL | Freq: Once | INTRAMUSCULAR | 0 refills | Status: AC

## 2022-10-13 MED ORDER — FLUTICASONE PROPIONATE 50 MCG/ACT NA SUSP: 1.0000 | Freq: Every day | NASAL | 6 refills | Status: DC

## 2022-10-13 MED ORDER — ALPRAZOLAM 0.25 MG PO TABS: 0.2500 mg | ORAL_TABLET | Freq: Every day | ORAL | 0 refills | Status: DC | PRN

## 2022-10-13 NOTE — Progress Notes (Signed)
Subjective  CC:  Chief Complaint  Patient presents with   referral    Pt would like a referral to see allergist for seasonal allergies     HPI: Sherry Guzman is a 88 y.o. female who presents to the office today to address the problems listed above in the chief complaint. Allergies: Chronic allergic rhinitis.  Does take Zyrtec nightly.  Had a very difficult summer due to allergies: Runny eyes itchy eyes, congestion, postnasal drainage with cough.  She did take an antibiotic last week for UTI and reports that this seems to have improved her symptoms.  No fevers chills or sinus infection symptoms.  She has been to an allergist about 20 years ago and received immunotherapy which was helpful at that time.  Does not use any other allergy medicines. Hypertension follow-up: Doing very well.  Has normal readings.  No chest pain or shortness of breath.  Remains very active.  Tolerates medications. Immunizations: Remains eligible for Shingrix.  She did have Zostavax in the past.  Eligible for flu shot and COVID.  Assessment  1. Chronic allergic rhinitis   2. Essential hypertension   3. Vaccine counseling      Plan  Chronic allergic rhinitis: Continue Zyrtec and Flonase.  Education given.  Symptoms are currently mild.  Will monitor through fall.  Could consider additional medications and/or steroid burst.  Education given.  No need for referral at this time. Hypertension is well-controlled. Education given for Shingrix vaccinations.  Prescription given.  Unable to give high-dose flu vaccine today but she will return for this.  Recommend COVID-vaccine.  Follow up: December for physical 02/06/2023  No orders of the defined types were placed in this encounter.  Meds ordered this encounter  Medications   fluticasone (FLONASE) 50 MCG/ACT nasal spray    Sig: Place 1 spray into both nostrils daily.    Dispense:  16 g    Refill:  6   ALPRAZolam (XANAX) 0.25 MG tablet    Sig: Take 1 tablet  (0.25 mg total) by mouth daily as needed for anxiety.    Dispense:  20 tablet    Refill:  0   Zoster Vaccine Adjuvanted [facility]) injection    Sig: Inject 0.5 mLs into the muscle once for 1 dose. Please give 2nd dose 2-6 months after first dose    Dispense:  2 each    Refill:  0      I reviewed the patients updated PMH, FH, and SocHx.    Patient Active Problem List   Diagnosis Date Noted   Mixed hyperlipidemia 02/01/2022    Priority: High   Essential hypertension 02/01/2022    Priority: High   Prediabetes 09/27/2017    Priority: High   Spondylosis of lumbar region without myelopathy or radiculopathy 02/01/2022    Priority: Medium    Idiopathic peripheral neuropathy 02/01/2022    Priority: Medium    Osteoporosis of lumbar spine 11/08/2011    Priority: Medium    GERD 08/01/2008    Priority: Medium    Diverticulosis of colon 02/01/2022    Priority: Low   Dry eye syndrome 10/05/2012    Priority: Low   Chronic allergic rhinitis 03/06/2007    Priority: Low   Current Meds  Medication Sig   alendronate (FOSAMAX) 70 MG tablet Take 1 tablet (70 mg total) by mouth every 7 (seven) days. Take with a full glass of water on an empty stomach.   Cholecalciferol (VITAMIN D3) 5000 UNITS TABS Take  1 tablet by mouth daily.    fluticasone (FLONASE) 50 MCG/ACT nasal spray Place 1 spray into both nostrils daily.   gabapentin (NEURONTIN) 300 MG capsule Take 1 capsule (300 mg total) by mouth 2 (two) times daily.   lisinopril (ZESTRIL) 5 MG tablet Take 1 tablet (5 mg total) by mouth daily.   rosuvastatin (CRESTOR) 10 MG tablet Take 1 tablet (10 mg total) by mouth daily.   Zoster Vaccine Adjuvanted [facility]) injection Inject 0.5 mLs into the muscle once for 1 dose. Please give 2nd dose 2-6 months after first dose   [DISCONTINUED] ALPRAZolam (XANAX) 0.25 MG tablet Take 1 tablet (0.25 mg total) by mouth daily as needed for anxiety.    Allergies: Patient is allergic to dust mite extract and pollen  extract. Family History: Patient family history includes Heart disease in her mother; Lung cancer in her father. Social History:  Patient  reports that she has never smoked. She has never used smokeless tobacco. She reports that she does not drink alcohol and does not use drugs.  Review of Systems: Constitutional: Negative for fever malaise or anorexia Cardiovascular: negative for chest pain Respiratory: negative for SOB or persistent cough Gastrointestinal: negative for abdominal pain  Objective  Vitals: BP 120/72 Comment: home reading this morning  Pulse 62   Temp 98.6 F (37 C)   Ht 5' 4.5" (1.638 m)   Wt 142 lb 3.2 oz (64.5 kg)   SpO2 97%   BMI 24.03 kg/m  General: no acute distress , A&Ox3 HEENT: PEERL, conjunctiva normal, neck is supple, no significant nasal congestion, no lymphadenopathy, no sinus tenderness Cardiovascular:  RRR without murmur or gallop.  Respiratory:  Good breath sounds bilaterally, CTAB with normal respiratory effort, no wheezing Skin:  Warm, no rashes  Commons side effects, risks, benefits, and alternatives for medications and treatment plan prescribed today were discussed, and the patient expressed understanding of the given instructions. Patient is instructed to call or message via MyChart if he/she has any questions or concerns regarding our treatment plan. No barriers to understanding were identified. We discussed Red Flag symptoms and signs in detail. Patient expressed understanding regarding what to do in case of urgent or emergency type symptoms.  Medication list was reconciled, printed and provided to the patient in AVS. Patient instructions and summary information was reviewed with the patient as documented in the AVS. This note was prepared with assistance of Dragon voice recognition software. Occasional wrong-word or sound-a-like substitutions may have occurred due to the inherent limitations of voice recognition software

## 2022-12-02 ENCOUNTER — Other Ambulatory Visit: Payer: Self-pay | Admitting: Family Medicine

## 2022-12-13 ENCOUNTER — Other Ambulatory Visit: Payer: Self-pay | Admitting: Family Medicine

## 2023-01-31 LAB — HM MAMMOGRAPHY

## 2023-02-01 ENCOUNTER — Encounter: Payer: Self-pay | Admitting: Family Medicine

## 2023-02-06 ENCOUNTER — Encounter: Payer: Medicare Other | Admitting: Family Medicine

## 2023-02-27 ENCOUNTER — Encounter: Payer: Self-pay | Admitting: Family Medicine

## 2023-02-27 ENCOUNTER — Ambulatory Visit (INDEPENDENT_AMBULATORY_CARE_PROVIDER_SITE_OTHER): Payer: Medicare Other | Admitting: Family Medicine

## 2023-02-27 VITALS — BP 124/72 | HR 69 | Temp 97.8°F | Ht 64.5 in | Wt 144.6 lb

## 2023-02-27 DIAGNOSIS — Z0001 Encounter for general adult medical examination with abnormal findings: Secondary | ICD-10-CM | POA: Diagnosis not present

## 2023-02-27 DIAGNOSIS — R7303 Prediabetes: Secondary | ICD-10-CM | POA: Diagnosis not present

## 2023-02-27 DIAGNOSIS — G609 Hereditary and idiopathic neuropathy, unspecified: Secondary | ICD-10-CM

## 2023-02-27 DIAGNOSIS — M810 Age-related osteoporosis without current pathological fracture: Secondary | ICD-10-CM

## 2023-02-27 DIAGNOSIS — I10 Essential (primary) hypertension: Secondary | ICD-10-CM | POA: Diagnosis not present

## 2023-02-27 DIAGNOSIS — Z780 Asymptomatic menopausal state: Secondary | ICD-10-CM

## 2023-02-27 DIAGNOSIS — E782 Mixed hyperlipidemia: Secondary | ICD-10-CM | POA: Diagnosis not present

## 2023-02-27 LAB — LIPID PANEL
Cholesterol: 176 mg/dL (ref 0–200)
HDL: 72.8 mg/dL (ref >39.00)
LDL Cholesterol: 83 mg/dL (ref 0–99)
NonHDL: 103.32
Total CHOL/HDL Ratio: 2
Triglycerides: 100 mg/dL (ref 0.0–149.0)
VLDL: 20 mg/dL (ref 0.0–40.0)

## 2023-02-27 LAB — CBC WITH DIFFERENTIAL/PLATELET
Basophils Absolute: 0.1 10*3/uL (ref 0.0–0.1)
Basophils Relative: 0.7 % (ref 0.0–3.0)
Eosinophils Absolute: 0.1 10*3/uL (ref 0.0–0.7)
Eosinophils Relative: 1.3 % (ref 0.0–5.0)
HCT: 39.5 % (ref 36.0–46.0)
Hemoglobin: 13.4 g/dL (ref 12.0–15.0)
Lymphocytes Relative: 17.9 % (ref 12.0–46.0)
Lymphs Abs: 1.6 10*3/uL (ref 0.7–4.0)
MCHC: 33.9 g/dL (ref 30.0–36.0)
MCV: 90.3 fL (ref 78.0–100.0)
Monocytes Absolute: 0.7 10*3/uL (ref 0.1–1.0)
Monocytes Relative: 7.4 % (ref 3.0–12.0)
Neutro Abs: 6.5 10*3/uL (ref 1.4–7.7)
Neutrophils Relative %: 72.7 % (ref 43.0–77.0)
Platelets: 259 10*3/uL (ref 150.0–400.0)
RBC: 4.38 Mil/uL (ref 3.87–5.11)
RDW: 14.6 % (ref 11.5–15.5)
WBC: 9 10*3/uL (ref 4.0–10.5)

## 2023-02-27 LAB — COMPREHENSIVE METABOLIC PANEL
ALT: 13 U/L (ref 0–35)
AST: 21 U/L (ref 0–37)
Albumin: 4.6 g/dL (ref 3.5–5.2)
Alkaline Phosphatase: 47 U/L (ref 39–117)
BUN: 14 mg/dL (ref 6–23)
CO2: 26 meq/L (ref 19–32)
Calcium: 9.8 mg/dL (ref 8.4–10.5)
Chloride: 101 meq/L (ref 96–112)
Creatinine, Ser: 1.12 mg/dL (ref 0.40–1.20)
GFR: 44.49 mL/min — ABNORMAL LOW (ref >60.00)
Glucose, Bld: 110 mg/dL — ABNORMAL HIGH (ref 70–99)
Potassium: 3.9 meq/L (ref 3.5–5.1)
Sodium: 134 meq/L — ABNORMAL LOW (ref 135–145)
Total Bilirubin: 0.4 mg/dL (ref 0.2–1.2)
Total Protein: 7.4 g/dL (ref 6.0–8.3)

## 2023-02-27 LAB — TSH: TSH: 2.29 u[IU]/mL (ref 0.35–5.50)

## 2023-02-27 LAB — HEMOGLOBIN A1C: Hgb A1c MFr Bld: 6.6 % — ABNORMAL HIGH (ref 4.6–6.5)

## 2023-02-27 NOTE — Patient Instructions (Signed)
 Please return in 12 months for your annual complete physical; please come fasting.   I will release your lab results to you on your MyChart account with further instructions. You may see the results before I do, but when I review them I will send you a message with my report or have my assistant call you if things need to be discussed. Please reply to my message with any questions. Thank you!   If you have any questions or concerns, please don't hesitate to send me a message via MyChart or call the office at [phone]. Thank you for visiting with us  today! It's our pleasure caring for you.   VISIT SUMMARY:  Today, you had a routine follow-up appointment. You reported no new health complaints and are tolerating your current medications well. We discussed your ongoing management of peripheral neuropathy, hypertension, hyperlipidemia, and osteoporosis. You are managing your chronic conditions well and are satisfied with your current state of health.  YOUR PLAN:  -PERIPHERAL NEUROPATHY: Peripheral neuropathy is a condition that results in numbness, tingling, and pain, usually in the hands and feet. You reported a burning sensation in your feet before your nighttime dose of gabapentin . We will continue your current dose of gabapentin  twice a day, but if your symptoms worsen, we may consider increasing it to three times a day.  -HYPERTENSION: Hypertension, or high blood pressure, is well-controlled with your current medication. We will monitor your kidney function and potassium levels to ensure there are no adverse effects from the medication.  -HYPERLIPIDEMIA: Hyperlipidemia is a condition where there are high levels of fats (lipids) in your blood. Your cholesterol medication is well-tolerated, and we will check your cholesterol levels to ensure they are within the target range.  -OSTEOPOROSIS: Osteoporosis is a condition that weakens bones, making them fragile and more likely to break. You are  tolerating Fosamax  well, and we will continue this medication. We will also order a bone density scan for December, which will be coordinated with your mammogram appointment.  -GENERAL HEALTH MAINTENANCE: You are in generally good health with no new complaints. We discussed the importance of regular screenings and follow-ups. You reported no chest pain, shortness of breath, or palpitations, and no new symptoms of high blood sugar.   INSTRUCTIONS:  Please follow up for an annual visit next year. We will review your lab results and communicate any necessary adjustments to your treatment via MyChart. Additionally, remember to schedule your bone density scan and mammogram for December.

## 2023-02-27 NOTE — Progress Notes (Signed)
 Subjective  Chief Complaint  Patient presents with   Annual Exam    Pt jere foor Annual Exam and is currentlt fasting    Hypertension    HPI: Sherry Guzman is a 89 y.o. female who presents to [facility] [place] today for a Female Wellness Visit. She also has the concerns and/or needs as listed above in the chief complaint. These will be addressed in addition to the Health Maintenance Visit.   Wellness Visit: annual visit with health maintenance review and exam  HM: mammo current: elects to still screen given her health status and active. Dexa due next year. Imms current. Happy and healthy and active! Chronic disease f/u and/or acute problem visit: (deemed necessary to be done in addition to the wellness visit): Discussed the use of AI scribe software for clinical note transcription with the patient, who gave verbal consent to proceed.  History of Present Illness   The patient, an 89 year old with a history of hypertension, hyperlipidemia, and neuropathy, presents for a annual follow-up. She reports a good year with no new health complaints. She has been tolerating her current medications well, including Fosamax  for bone health, with no reported side effects.  The patient has been managing neuropathic symptoms with Gabapentin , taken twice daily. She reports that the medication has been helpful, but she has noticed that her feet become red and start to burn before her nighttime dose. Despite this, the medication does not disrupt her sleep. She expresses concern about the persistent burning sensation, but denies any noticeable numbness.  The patient also mentions a history of urinary tract infections, with two episodes in her lifetime. She has been taking A, Z, O cranberry daily as a preventive measure.  Overall, the patient appears to be managing her chronic conditions well and is adhering to her prescribed medication regimen. She expresses satisfaction with her  current state of health and has no new complaints.   IFG: no sxs of hyperglycemia.    Assessment  1. Encounter for well adult exam with abnormal findings   2. Essential hypertension   3. Mixed hyperlipidemia   4. Prediabetes   5. Idiopathic peripheral neuropathy   6. Osteoporosis of lumbar spine      Plan  Female Wellness Visit: Age appropriate Health Maintenance and Prevention measures were discussed with patient. Included topics are cancer screening recommendations, ways to keep healthy (see AVS) including dietary and exercise recommendations, regular eye and dental care, use of seat belts, and avoidance of moderate alcohol use and tobacco use.  BMI: discussed patient's BMI and encouraged positive lifestyle modifications to help get to or maintain a target BMI. HM needs and immunizations were addressed and ordered. See below for orders. See HM and immunization section for updates. Routine labs and screening tests ordered including cmp, cbc and lipids where appropriate. Discussed recommendations regarding Vit D and calcium  supplementation (see AVS)  Chronic disease management visit and/or acute problem visit: Assessment and Plan    Peripheral Neuropathy   Reports burning sensation in feet, particularly before the nighttime dose of gabapentin . Currently taking gabapentin  twice a day, which helps but symptoms are still bothersome. Discussed the possibility of increasing the dose to three times a day if symptoms worsen. No numbness reported.   - Continue gabapentin  twice a day   - Consider increasing gabapentin  to three times a day if symptoms worsen    Hypertension   Blood pressure is well-controlled with current medication regimen. Lisinopril  5 daily Plan  to monitor kidney function and potassium levels to ensure no adverse effects from medication.   - Check kidney function   - Check potassium levels    Hyperlipidemia   Cholesterol medication is well-tolerated. Lipitor 10 at  bedtime. Plan to monitor cholesterol levels to ensure she is within target range.   - Check cholesterol levels    Osteoporosis   Fosamax  is well-tolerated. Plan to continue current medication and monitor bone density next year. Bone density scan to be coordinated with mammogram appointment in December.   - Continue Fosamax    - Order bone density scan for December and coordinate with mammogram appointment    General Health Maintenance   Generally in good health with no new complaints. Discussed the importance of regular screenings and follow-ups. No chest pain, shortness of breath, or palpitations reported. No new symptoms of hyperglycemia. Advised against daily use of Azo for UTI prevention.   - Annual follow-up visit   Follow up: 12 mo for cpe   No orders of the defined types were placed in this encounter.  No orders of the defined types were placed in this encounter.     Body mass index is 24.44 kg/m. Wt Readings from Last 3 Encounters:  02/27/23 144 lb 9.6 oz (65.6 kg)  10/13/22 142 lb 3.2 oz (64.5 kg)  08/22/22 144 lb 4 oz (65.4 kg)     Patient Active Problem List   Diagnosis Date Noted Date Diagnosed   Mixed hyperlipidemia 02/01/2022     Priority: High   Essential hypertension 02/01/2022     Priority: High   Prediabetes 09/27/2017     Priority: High   Spondylosis of lumbar region without myelopathy or radiculopathy 02/01/2022     Priority: Medium    Idiopathic peripheral neuropathy 02/01/2022     Priority: Medium    Osteoporosis of lumbar spine 11/08/2011     Priority: Medium     DEXA 10/2011; h/o actonel.  11/2013: DEXA T=-2.2, [facility]. Continue vit D and Ca. Recheck 2 years. Start meds if declining. Pt agrees.  DEXA 12/2015 = T=-2.6 at L/S. Better at other sites. rec restart meds DEXA 01/2022: lowest T= 2.8 lumbar, osteopenia at other sites. Start fosamax  [facility]    GERD 08/01/2008     Priority: Medium    Diverticulosis of colon 02/01/2022     Priority: Low   Dry  eye syndrome 10/05/2012     Priority: Low   Chronic allergic rhinitis 03/06/2007     Priority: Low   Health Maintenance  Topic Date Due   Zoster Vaccines- Shingrix  (2 of 2) 09/01/2016   Medicare Annual Wellness (AWV)  06/13/2017   COVID-19 Vaccine (1 - 2024-25 season) 03/15/2023 (Originally 10/16/2022)   DEXA SCAN  01/26/2024   DTaP/Tdap/Td (3 - Td or Tdap) 12/01/2027   Pneumonia Vaccine 35+ Years old  Completed   INFLUENZA VACCINE  Completed   HPV VACCINES  Aged Out   Immunization History  Administered Date(s) Administered   Fluad Quad(high Dose 65+) 11/17/2016, 11/30/2017, 10/20/2020, 11/20/2020   Influenza, High Dose Seasonal PF 11/08/2011, 11/11/2014, 11/11/2014, 12/15/2022   Influenza,trivalent, recombinat, inj, PF 02/14/2009   Influenza-Unspecified 12/19/2013   Pneumococcal Conjugate-13 12/19/2013   Pneumococcal Polysaccharide-23 02/14/2002, 11/08/2011   Tdap 03/17/2008, 11/30/2017   Zoster Recombinant(Shingrix ) 07/07/2016   Zoster, Live 02/14/2005, 05/07/2016   Zoster, Unspecified 05/07/2016   We updated and reviewed the patient's past history in detail and it is documented below. Allergies: Patient is allergic to dust mite extract and  pollen extract. Past Medical History Patient  has a past medical history of Allergic rhinitis, cause unspecified, Anal fissure, Diverticulosis of colon (without mention of hemorrhage), GERD (gastroesophageal reflux disease), Hyperlipemia, Internal hemorrhoids, Osteoarthritis, and Peripheral neuropathy. Past Surgical History Patient  has a past surgical history that includes Nasal sinus surgery; Hemorrhoid surgery; Biopsy breast; and Tonsillectomy. Family History: Patient family history includes Heart disease in her mother; Lung cancer in her father. Social History:  Patient  reports that she has never smoked. She has never used smokeless tobacco. She reports that she does not drink alcohol and does not use drugs.  Review of  Systems: Constitutional: negative for fever or malaise Ophthalmic: negative for photophobia, double vision or loss of vision Cardiovascular: negative for chest pain, dyspnea on exertion, or new LE swelling Respiratory: negative for SOB or persistent cough Gastrointestinal: negative for abdominal pain, change in bowel habits or melena Genitourinary: negative for dysuria or gross hematuria, no abnormal uterine bleeding or disharge Musculoskeletal: negative for new gait disturbance or muscular weakness Integumentary: negative for new or persistent rashes, no breast lumps Neurological: negative for TIA or stroke symptoms Psychiatric: negative for SI or delusions Allergic/Immunologic: negative for hives  Patient Care Team    Relationship Specialty Notifications Start End  Jodie Lavern CROME, MD PCP - General Family Medicine  02/01/22   Teressa Toribio SQUIBB, MD (Inactive) Consulting Physician Gastroenterology  02/01/22   Robinson Pao, MD Consulting Physician Dermatology  02/01/22     Objective  Vitals: BP 124/72   Pulse 69   Temp 97.8 F (36.6 C)   Ht 5' 4.5 (1.638 m)   Wt 144 lb 9.6 oz (65.6 kg)   SpO2 96%   BMI 24.44 kg/m  General:  Well developed, well nourished, no acute distress  Psych:  Alert and orientedx3,normal mood and affect HEENT:  Normocephalic, atraumatic, non-icteric sclera,  supple neck without adenopathy, mass or thyromegaly Cardiovascular:  Normal S1, S2, RRR without gallop, rub or murmur Respiratory:  Good breath sounds bilaterally, CTAB with normal respiratory effort Gastrointestinal: normal bowel sounds, soft, non-tender, no noted masses. No HSM MSK: extremities without edema, joints without erythema or swelling Neurologic:    Mental status is normal.  Gross motor and sensory exams are normal.  No tremor  Commons side effects, risks, benefits, and alternatives for medications and treatment plan prescribed today were discussed, and the patient expressed understanding of  the given instructions. Patient is instructed to call or message via MyChart if he/she has any questions or concerns regarding our treatment plan. No barriers to understanding were identified. We discussed Red Flag symptoms and signs in detail. Patient expressed understanding regarding what to do in case of urgent or emergency type symptoms.  Medication list was reconciled, printed and provided to the patient in AVS. Patient instructions and summary information was reviewed with the patient as documented in the AVS. This note was prepared with assistance of Dragon voice recognition software. Occasional wrong-word or sound-a-like substitutions may have occurred due to the inherent limitations of voice recognition software

## 2023-02-28 ENCOUNTER — Telehealth: Payer: Self-pay | Admitting: Family Medicine

## 2023-02-28 ENCOUNTER — Encounter: Payer: Self-pay | Admitting: Family Medicine

## 2023-02-28 NOTE — Telephone Encounter (Signed)
Pt would like her lab results mailed to her.  Please advise

## 2023-03-03 ENCOUNTER — Telehealth: Payer: Self-pay | Admitting: Family Medicine

## 2023-03-03 NOTE — Progress Notes (Signed)
Please call patient:please let her know that her sugar test is running high; in diabetic range now. I recommend a low sugar diet and please ask her to schedule with me again in 3 months so I can discuss this with her and recheck it. All other labs look good. Please mail her a copy. thanks

## 2023-03-03 NOTE — Telephone Encounter (Signed)
Labs has been printed off and ready to be mailed

## 2023-03-03 NOTE — Telephone Encounter (Signed)
Pt called back and still needs a copy of her labs to me mailed to her.

## 2023-03-03 NOTE — Telephone Encounter (Signed)
Lvm for pt requesting cb to schedule a 3 mo f/u with pcp

## 2023-06-19 ENCOUNTER — Telehealth: Payer: Self-pay

## 2023-06-19 NOTE — Telephone Encounter (Signed)
 Copied from CRM [id number]. Topic: Clinical - Request for Lab/Test Order >> Jun 19, 2023  8:31 AM Sherry Guzman wrote: Reason for CRM: The patient is requesting to see if she needs lab work done before her next scheduled appointment on 5/14. The patient's call back number is [phone].  Please advise if the pt is needing labs   Message has been sent to provider to address

## 2023-06-20 ENCOUNTER — Other Ambulatory Visit: Payer: Medicare Other

## 2023-06-20 ENCOUNTER — Ambulatory Visit: Payer: Medicare Other | Admitting: Family Medicine

## 2023-06-21 ENCOUNTER — Encounter (HOSPITAL_COMMUNITY): Payer: Self-pay

## 2023-06-28 ENCOUNTER — Ambulatory Visit: Admitting: Family Medicine

## 2023-06-28 ENCOUNTER — Encounter: Payer: Self-pay | Admitting: Family Medicine

## 2023-06-28 VITALS — BP 122/70 | HR 61 | Temp 97.7°F | Ht 64.5 in | Wt 139.0 lb

## 2023-06-28 DIAGNOSIS — E119 Type 2 diabetes mellitus without complications: Secondary | ICD-10-CM | POA: Diagnosis not present

## 2023-06-28 DIAGNOSIS — G609 Hereditary and idiopathic neuropathy, unspecified: Secondary | ICD-10-CM

## 2023-06-28 DIAGNOSIS — I10 Essential (primary) hypertension: Secondary | ICD-10-CM

## 2023-06-28 DIAGNOSIS — K644 Residual hemorrhoidal skin tags: Secondary | ICD-10-CM | POA: Diagnosis not present

## 2023-06-28 DIAGNOSIS — M461 Sacroiliitis, not elsewhere classified: Secondary | ICD-10-CM | POA: Diagnosis not present

## 2023-06-28 LAB — POCT GLYCOSYLATED HEMOGLOBIN (HGB A1C): Hemoglobin A1C: 5.9 % — AB (ref 4.0–5.6)

## 2023-06-28 LAB — MICROALBUMIN / CREATININE URINE RATIO
Creatinine,U: 41.6 mg/dL
Microalb Creat Ratio: UNDETERMINED mg/g (ref 0.0–30.0)
Microalb, Ur: <0.7 mg/dL

## 2023-06-28 MED ORDER — DICLOFENAC SODIUM 75 MG PO TBEC: 75.0000 mg | DELAYED_RELEASE_TABLET | Freq: Two times a day (BID) | ORAL | 0 refills | Status: DC

## 2023-06-28 MED ORDER — ALPRAZOLAM 0.25 MG PO TABS: 0.2500 mg | ORAL_TABLET | Freq: Every day | ORAL | 0 refills | Status: AC | PRN

## 2023-06-28 MED ORDER — LIDOCAINE-HYDROCORTISONE ACE 3-0.5 % EX CREA: TOPICAL_CREAM | CUTANEOUS | 1 refills | Status: DC

## 2023-06-28 NOTE — Progress Notes (Signed)
 Subjective  CC:  Chief Complaint  Patient presents with   Diabetes    HPI: Sherry Guzman is a 89 y.o. female who presents to the office today for follow up of diabetes and problems listed above in the chief complaint.  Discussed the use of AI scribe software for clinical note transcription with the patient, who gave verbal consent to proceed.  History of Present Illness Sherry Guzman is an 89 year old female with diabetes who presents for a new onset diabetes follow-up to check her A1c levels. Also c/o back pain and hemorrhoids:  DM: Her A1c has decreased from 6.6 to 5.9, which she attributes to increased physical activity, including washing and staining decks. No significant dietary changes. No sxs of hyperglycemia. No foot sores  She has been experiencing pain in her right low back for the past five to six days, which she believes was caused by lifting a heavy pressure washer. The pain is described as miserable, especially when walking, but improves with sitting and lying down. She has been using a TENS machine and arthritis cream, and has taken Advil, two tablets twice a day for the last two days, which has helped to calm the pain. No radicular sxs. No leg weakness. No b/b dysfunction.  She has a long-standing history of hemorrhoids, which she has had for sixty years. She recently experienced a flare-up, which improved after sitting in warm Epsom salts water and using a cream, although the cream was expired.  Idiopathic peripheral neuropathy, for which she takes 300 mg of medication in the morning and at night. She is active, maintaining four yards and engaging in physical activities such as planting and deck maintenance. She has lost three to four pounds recently and is pleased with her weight management.    Wt Readings from Last 3 Encounters:  06/28/23 139 lb (63 kg)  02/27/23 144 lb 9.6 oz (65.6 kg)  10/13/22 142 lb 3.2 oz (64.5 kg)    BP Readings from Last 3  Encounters:  06/28/23 122/70  02/27/23 124/72  10/13/22 120/72    Assessment  1. Type 2 diabetes mellitus without complication, without long-term current use of insulin (HCC)   2. Essential hypertension   3. Sacroiliitis (HCC)   4. External hemorrhoids   5. Idiopathic peripheral neuropathy      Plan  Assessment and Plan Assessment & Plan Sacroiliitis: Acute inflammation likely from recent exertion. Pain improving but persists with ambulation. - Prescribe anti-inflammatory medication, one pill twice daily for 7-10 days. Voltaren 75 - Advise icing the sacroiliac joint. - Recommend stretching exercises.  Type 2 diabetes mellitus without complications A1c improved to 5.9. Increased activity contributed to weight loss and better glucose levels. - Continue current diabetes management plan. - Monitor sugar intake. - Schedule follow-up A1c test in six months.  Neuropathy Chronic neuropathy managed with current regimen. Considering dose increase for better control. - Consider increasing neuropathy medication to three times daily if needed.  Hemorrhoids Chronic hemorrhoids with recent resolved flare-up. - Refill hemorrhoid cream prescription for future use.    Follow up: 6 mo for dm recheck  Orders Placed This Encounter  Procedures   Microalbumin / creatinine urine ratio   POCT HgB A1C   Meds ordered this encounter  Medications   ALPRAZolam  (XANAX ) 0.25 MG tablet    Sig: Take 1 tablet (0.25 mg total) by mouth daily as needed for anxiety.    Dispense:  20 tablet    Refill:  0  Lidocaine -Hydrocortisone Ace 3-0.5 % CREA    Sig: Apply topically daily as needed    Dispense:  30 g    Refill:  1   diclofenac (VOLTAREN) 75 MG EC tablet    Sig: Take 1 tablet (75 mg total) by mouth 2 (two) times daily.    Dispense:  30 tablet    Refill:  0      Immunization History  Administered Date(s) Administered   Fluad Quad(high Dose 65+) 11/17/2016, 11/30/2017, 10/20/2020,  11/20/2020   Influenza, High Dose Seasonal PF 11/08/2011, 11/11/2014, 11/11/2014, 12/15/2022   Influenza,trivalent, recombinat, inj, PF 02/14/2009   Influenza-Unspecified 12/19/2013   Pneumococcal Conjugate-13 12/19/2013   Pneumococcal Polysaccharide-23 02/14/2002, 11/08/2011   Tdap 03/17/2008, 11/30/2017   Zoster Recombinant(Shingrix ) 05/07/2016, 07/07/2016   Zoster, Live 02/14/2005, 05/07/2016   Zoster, Unspecified 05/07/2016    Diabetes Related Lab Review: Lab Results  Component Value Date   HGBA1C 5.9 (A) 06/28/2023   HGBA1C 6.6 (H) 02/27/2023   HGBA1C 6.2 02/01/2022    No results found for: "MICROALBUR", "[id number]" Lab Results  Component Value Date   CREATININE 1.12 02/27/2023   BUN 14 02/27/2023   NA 134 (L) 02/27/2023   K 3.9 02/27/2023   CL 101 02/27/2023   CO2 26 02/27/2023   Lab Results  Component Value Date   CHOL 176 02/27/2023   CHOL 182 02/01/2022   Lab Results  Component Value Date   HDL 72.80 02/27/2023   HDL 78.50 02/01/2022   Lab Results  Component Value Date   LDLCALC 83 02/27/2023   LDLCALC 82 02/01/2022   Lab Results  Component Value Date   TRIG 100.0 02/27/2023   TRIG 109.0 02/01/2022   Lab Results  Component Value Date   CHOLHDL 2 02/27/2023   CHOLHDL 2 02/01/2022   No results found for: "LDLDIRECT" The ASCVD Risk score (Arnett DK, et al., 2019) failed to calculate for the following reasons:   The 2019 ASCVD risk score is only valid for ages 23 to 51 I have reviewed the PMH, Fam and Soc history. Patient Active Problem List   Diagnosis Date Noted Date Diagnosed   Mixed hyperlipidemia 02/01/2022     Priority: High   Essential hypertension 02/01/2022     Priority: High   Prediabetes 09/27/2017     Priority: High   Spondylosis of lumbar region without myelopathy or radiculopathy 02/01/2022     Priority: Medium    Idiopathic peripheral neuropathy 02/01/2022     Priority: Medium    Osteoporosis of lumbar spine 11/08/2011      Priority: Medium     DEXA 10/2011; h/o actonel.  11/2013: DEXA T=-2.2, [facility]. Continue vit D and Ca. Recheck 2 years. Start meds if declining. Pt agrees.  DEXA 12/2015 = T=-2.6 at L/S. Better at other sites. rec restart meds DEXA 01/2022: lowest T= 2.8 lumbar, osteopenia at other sites. Start fosamax  [facility]    GERD 08/01/2008     Priority: Medium    Diverticulosis of colon 02/01/2022     Priority: Low   Dry eye syndrome 10/05/2012     Priority: Low   Chronic allergic rhinitis 03/06/2007     Priority: Low    Social History: Patient  reports that she has never smoked. She has never used smokeless tobacco. She reports that she does not drink alcohol and does not use drugs.  Review of Systems: Ophthalmic: negative for eye pain, loss of vision or double vision Cardiovascular: negative for chest pain Respiratory: negative for SOB  or persistent cough Gastrointestinal: negative for abdominal pain Genitourinary: negative for dysuria or gross hematuria MSK: negative for foot lesions Neurologic: negative for weakness or gait disturbance  Objective  Vitals: BP 122/70   Pulse 61   Temp 97.7 F (36.5 C)   Ht 5' 4.5" (1.638 m)   Wt 139 lb (63 kg)   SpO2 100%   BMI 23.49 kg/m  General: well appearing, no acute distress  Psych:  Alert and oriented, normal mood and affect Right SI joint: ttp, FrOM, neg slr Neurologic:   Mental status is normal. normal gait Foot exam: no erythema, pallor, or cyanosis visible nl proprioception and sensation to monofilament testing bilaterally, +2 distal pulses bilaterally  Diabetic education: ongoing education regarding chronic disease management for diabetes was given today. We continue to reinforce the ABC's of diabetic management: A1c (<7 or 8 dependent upon patient), tight blood pressure control, and cholesterol management with goal LDL < 100 minimally. We discuss diet strategies, exercise recommendations, medication options and possible side effects. At  each visit, we review recommended immunizations and preventive care recommendations for diabetics and stress that good diabetic control can prevent other problems. See below for this patient's data. Commons side effects, risks, benefits, and alternatives for medications and treatment plan prescribed today were discussed, and the patient expressed understanding of the given instructions. Patient is instructed to call or message via MyChart if he/she has any questions or concerns regarding our treatment plan. No barriers to understanding were identified. We discussed Red Flag symptoms and signs in detail. Patient expressed understanding regarding what to do in case of urgent or emergency type symptoms.  Medication list was reconciled, printed and provided to the patient in AVS. Patient instructions and summary information was reviewed with the patient as documented in the AVS. This note was prepared with assistance of Dragon voice recognition software. Occasional wrong-word or sound-a-like substitutions may have occurred due to the inherent limitations of voice recognition software

## 2023-06-28 NOTE — Patient Instructions (Addendum)
 Please return in 6 months for diabetes follow up   Happy Early Birthday!!!! You are amazing!  If you have any questions or concerns, please don't hesitate to send me a message via MyChart or call the office at [phone]. Thank you for visiting with us  today! It's our pleasure caring for you.    VISIT SUMMARY: Today, we reviewed your recent health concerns and made adjustments to your treatment plan. Your A1c levels have improved, and we discussed your sacroiliac joint pain, neuropathy, and hemorrhoids.  YOUR PLAN: -SACROILIAC JOINT INFLAMMATION: Sacroiliac joint inflammation is pain in the joint between your spine and pelvis, often caused by heavy lifting or exertion. You will take an anti-inflammatory medication twice daily for 7-10 days, use ice on the joint, and perform stretching exercises to help alleviate the pain.  -TYPE 2 DIABETES MELLITUS WITHOUT COMPLICATIONS: Type 2 diabetes is a condition where your body has difficulty regulating blood sugar levels. Your A1c has improved to 5.9 due to increased physical activity. Continue with your current diabetes management plan, monitor your sugar intake, and schedule a follow-up A1c test in six months.  -NEUROPATHY: Neuropathy is nerve damage that can cause pain, often in the hands and feet. You are currently managing this with medication. If needed, you may increase the dosage to three times daily for better control.  -HEMORRHOIDS: Hemorrhoids are swollen veins in the lower rectum or anus that can cause discomfort. Your recent flare-up has resolved. A prescription for hemorrhoid cream will be refilled for future use.  INSTRUCTIONS: Please schedule a follow-up A1c test in six months to monitor your diabetes. If your sacroiliac joint pain does not improve or worsens, contact the office for further evaluation.                      Contains text generated by Abridge.                                  Contains text generated by Abridge.

## 2023-09-24 ENCOUNTER — Other Ambulatory Visit: Payer: Self-pay | Admitting: Family Medicine

## 2023-10-24 ENCOUNTER — Other Ambulatory Visit: Payer: Self-pay | Admitting: Family Medicine

## 2023-11-21 ENCOUNTER — Telehealth: Payer: Self-pay

## 2023-11-21 NOTE — Telephone Encounter (Signed)
 Copied from CRM #[id number]. Topic: General - Other >> Nov 21, 2023  8:28 AM Macario HERO wrote: Reason for CRM: Patient is requesting the office called [facility] to confirm that it's okay to get a bone density.

## 2024-01-02 ENCOUNTER — Ambulatory Visit: Admitting: Family Medicine

## 2024-02-01 LAB — HM MAMMOGRAPHY

## 2024-02-01 LAB — HM DEXA SCAN

## 2024-02-02 ENCOUNTER — Encounter: Payer: Self-pay | Admitting: Family Medicine

## 2024-02-05 ENCOUNTER — Encounter: Payer: Self-pay | Admitting: Family Medicine

## 2024-02-27 ENCOUNTER — Encounter: Payer: Self-pay | Admitting: Family Medicine

## 2024-02-29 ENCOUNTER — Ambulatory Visit: Payer: Medicare Other | Admitting: Family Medicine

## 2024-02-29 ENCOUNTER — Encounter: Payer: Self-pay | Admitting: Family Medicine

## 2024-02-29 VITALS — BP 134/79 | HR 65 | Temp 97.7°F | Ht 64.5 in | Wt 145.2 lb

## 2024-02-29 DIAGNOSIS — M7061 Trochanteric bursitis, right hip: Secondary | ICD-10-CM | POA: Diagnosis not present

## 2024-02-29 DIAGNOSIS — G609 Hereditary and idiopathic neuropathy, unspecified: Secondary | ICD-10-CM | POA: Diagnosis not present

## 2024-02-29 DIAGNOSIS — E119 Type 2 diabetes mellitus without complications: Secondary | ICD-10-CM | POA: Diagnosis not present

## 2024-02-29 DIAGNOSIS — J309 Allergic rhinitis, unspecified: Secondary | ICD-10-CM | POA: Diagnosis not present

## 2024-02-29 DIAGNOSIS — E782 Mixed hyperlipidemia: Secondary | ICD-10-CM | POA: Diagnosis not present

## 2024-02-29 DIAGNOSIS — M810 Age-related osteoporosis without current pathological fracture: Secondary | ICD-10-CM | POA: Diagnosis not present

## 2024-02-29 DIAGNOSIS — Z0001 Encounter for general adult medical examination with abnormal findings: Secondary | ICD-10-CM | POA: Diagnosis not present

## 2024-02-29 DIAGNOSIS — I10 Essential (primary) hypertension: Secondary | ICD-10-CM

## 2024-02-29 LAB — CBC WITH DIFFERENTIAL/PLATELET
Basophils Absolute: 0 K/uL (ref 0.0–0.1)
Basophils Relative: 0.8 % (ref 0.0–3.0)
Eosinophils Absolute: 0.1 K/uL (ref 0.0–0.7)
Eosinophils Relative: 2 % (ref 0.0–5.0)
HCT: 37 % (ref 36.0–46.0)
Hemoglobin: 12.8 g/dL (ref 12.0–15.0)
Lymphocytes Relative: 29.3 % (ref 12.0–46.0)
Lymphs Abs: 1.7 K/uL (ref 0.7–4.0)
MCHC: 34.6 g/dL (ref 30.0–36.0)
MCV: 88.1 fl (ref 78.0–100.0)
Monocytes Absolute: 0.6 K/uL (ref 0.1–1.0)
Monocytes Relative: 10.2 % (ref 3.0–12.0)
Neutro Abs: 3.3 K/uL (ref 1.4–7.7)
Neutrophils Relative %: 57.7 % (ref 43.0–77.0)
Platelets: 268 K/uL (ref 150.0–400.0)
RBC: 4.2 Mil/uL (ref 3.87–5.11)
RDW: 14.8 % (ref 11.5–15.5)
WBC: 5.7 K/uL (ref 4.0–10.5)

## 2024-02-29 LAB — COMPREHENSIVE METABOLIC PANEL WITH GFR
ALT: 11 U/L (ref 3–35)
AST: 19 U/L (ref 5–37)
Albumin: 4.8 g/dL (ref 3.5–5.2)
Alkaline Phosphatase: 40 U/L (ref 39–117)
BUN: 12 mg/dL (ref 6–23)
CO2: 28 meq/L (ref 19–32)
Calcium: 9.7 mg/dL (ref 8.4–10.5)
Chloride: 99 meq/L (ref 96–112)
Creatinine, Ser: 0.91 mg/dL (ref 0.40–1.20)
GFR: 56.68 mL/min — ABNORMAL LOW
Glucose, Bld: 96 mg/dL (ref 70–99)
Potassium: 4.5 meq/L (ref 3.5–5.1)
Sodium: 134 meq/L — ABNORMAL LOW (ref 135–145)
Total Bilirubin: 0.4 mg/dL (ref 0.2–1.2)
Total Protein: 7.2 g/dL (ref 6.0–8.3)

## 2024-02-29 LAB — LIPID PANEL
Cholesterol: 196 mg/dL (ref 28–200)
HDL: 71.4 mg/dL
LDL Cholesterol: 101 mg/dL — ABNORMAL HIGH (ref 10–99)
NonHDL: 124.12
Total CHOL/HDL Ratio: 3
Triglycerides: 114 mg/dL (ref 10.0–149.0)
VLDL: 22.8 mg/dL (ref 0.0–40.0)

## 2024-02-29 LAB — MICROALBUMIN / CREATININE URINE RATIO
Creatinine,U: 63.4 mg/dL
Microalb Creat Ratio: 18.2 mg/g (ref 0.0–30.0)
Microalb, Ur: 1.2 mg/dL (ref 0.7–1.9)

## 2024-02-29 LAB — HEMOGLOBIN A1C: Hgb A1c MFr Bld: 6.4 % (ref 4.6–6.5)

## 2024-02-29 LAB — TSH: TSH: 2.74 u[IU]/mL (ref 0.35–5.50)

## 2024-02-29 MED ORDER — FLUTICASONE PROPIONATE 50 MCG/ACT NA SUSP: 1.0000 | Freq: Every day | NASAL | 6 refills | Status: AC

## 2024-02-29 MED ORDER — LIDOCAINE-HYDROCORTISONE ACE 3-0.5 % EX CREA: TOPICAL_CREAM | CUTANEOUS | 2 refills | Status: AC

## 2024-02-29 NOTE — Patient Instructions (Signed)
 Please return in 6 months to recheck diabetes and blood pressure   I will release your lab results to you on your MyChart account with further instructions. You may see the results before I do, but when I review them I will send you a message with my report or have my assistant call you if things need to be discussed. Please reply to my message with any questions. Thank you!   Get cetirizine (generic Zyrtec) for your allergies. Schedule your mammogram :)  If you have any questions or concerns, please don't hesitate to send me a message via MyChart or call the office at [phone]. Thank you for visiting with us  today! It's our pleasure caring for you.    VISIT SUMMARY: During your visit, we discussed the management of your osteoporosis, allergies, hip pain, shoulder pain, diabetes, hypertension, and cholesterol levels. We reviewed your current medications and made some adjustments to better manage your symptoms.  YOUR PLAN: -OSTEOPOROSIS OF LUMBAR SPINE: Osteoporosis is a condition where bones become weak and brittle. Your condition has improved with Fosamax , and you should continue taking it for at least five years.  -ALLERGIC RHINITIS: Allergic rhinitis is an allergic reaction that causes sneezing, runny nose, and nasal dryness. We recommend switching to cetirizine (Zyrtec) for better symptom control and using Flonase  nasal spray regularly for several weeks.  -TROCHANTERIC BURSITIS: Trochanteric bursitis is inflammation of the bursa near the hip. Continue using arthritis cream, and if symptoms do not improve, we may consider a steroid injection or a short course of diclofenac .  -SHOULDER OSTEOARTHRITIS WITH ROTATOR CUFF TEAR: Shoulder osteoarthritis is the wear and tear of the shoulder joint, and a rotator cuff tear is a tear in the shoulder muscles. We will consider steroid injections if your symptoms worsen during the mowing season.  -TYPE 2 DIABETES MELLITUS: Type 2 diabetes is a  condition that affects blood sugar control. We have ordered blood work to recheck your blood sugar levels.  -ESSENTIAL HYPERTENSION: Hypertension is high blood pressure. Your blood pressure is well-controlled with your current medication.  -MIXED HYPERLIPIDEMIA: Mixed hyperlipidemia is having high levels of different types of fats in the blood. We have ordered blood work to check your cholesterol levels.  -GENERAL HEALTH MAINTENANCE: There is no family history of breast cancer. Continue with your current lifestyle and medication regimen.  INSTRUCTIONS: Please follow up with the ordered blood work to check your blood sugar and cholesterol levels. If your hip pain does not improve, we may consider a steroid injection or a short course of diclofenac . If your shoulder pain worsens, we may consider steroid injections during the mowing season.                      Contains text generated by Abridge.                                 Contains text generated by Abridge.

## 2024-03-03 ENCOUNTER — Ambulatory Visit: Payer: Self-pay | Admitting: Family Medicine

## 2024-03-03 NOTE — Progress Notes (Signed)
 " Subjective  Chief Complaint  Patient presents with   Annual Exam    Pt here for Annual Exam and is currently fasting    Diabetes   Hypertension    HPI: Sherry Guzman is a 89 y.o. female who presents to [facility] [place] today for a Female Wellness Visit. She also has the concerns and/or needs as listed above in the chief complaint. These will be addressed in addition to the Health Maintenance Visit.   Wellness Visit: annual visit with health maintenance review and exam  HM: remains active and feeling well. Mammo current. No new concerns. Needs eye exam.   Chronic disease f/u and/or acute problem visit: (deemed necessary to be done in addition to the wellness visit): Discussed the use of AI scribe software for clinical note transcription with the patient, who gave verbal consent to proceed.  History of Present Illness Sherry Guzman is an 89 year old female with osteoporosis who presents for follow-up and management of allergies and musculoskeletal pain.  Osteoporosis management - reviewed most recent, improved.  dexa: DEXA 01/2024; lowest T = - 2.4, improvement in lumbar and other sites: osteopenic range after 2 years of fosamax . Recommend continuation for 5 years total. Can reassess in 2-3 years. - On Fosamax  for two years - Remains compliant with medication - No issues swallowing the pill - no falls or fractures.   Allergic rhinitis symptoms - Severe allergy symptoms this year - Sneezing, rhinorrhea, dryness, and tickling in the throat - Uses Flonase  and a generic allergy pill comparable to Claritin daily  Right hip pain - Right lateral hip pain for the past two to three months - Described as deep pain, not located in the groin - Manages pain with arthritis cream - Concerned about worsening pain with upcoming mowing season - no low back pain or radicular sxs or leg weakness  Shoulder pain and dysfunction - History of arthritis in both  shoulders - MRI by Dr. Cyrena at [facility] showed severe arthritis and torn rotator cuff - Recent steroid injections have not been effective  Diabetes mellitus - Diagnosed one year ago with A1c at 6.7 - Managed with diet - Dietary adherence has lapsed over the holidays - Awaiting blood work to check glucose levels - no sxs of hyperglycemia. Needs eye exam. On ace and statin. No foot concerns  Hypertension and hyperlipidemia - Takes lisinopril  for blood pressure control, 5mg  daily. Feeling well. Taking medications w/o adverse effects. No symptoms of CHF, angina; no palpitations, sob, cp or lower extremity edema. Compliant with meds.   - Takes Crestor  for cholesterol management, no side effects - Doing well on both medications  Neuropathy: no changes remains on gabapentin  bid. No foot sores. idiopathic    Assessment  1. Encounter for well adult exam with abnormal findings   2. Mixed hyperlipidemia   3. Essential hypertension   4. Diet-controlled diabetes mellitus (HCC)   5. Idiopathic peripheral neuropathy   6. Osteoporosis of lumbar spine   7. Trochanteric bursitis of right hip   8. Chronic allergic rhinitis      Plan  Female Wellness Visit: Age appropriate Health Maintenance and Prevention measures were discussed with patient. Included topics are cancer screening recommendations, ways to keep healthy (see AVS) including dietary and exercise recommendations, regular eye and dental care, use of seat belts, and avoidance of moderate alcohol use and tobacco use. Discussed benefits vs risks of further breast cancer screening. Elect to continue screening for  now.  BMI: discussed patient's BMI and encouraged positive lifestyle modifications to help get to or maintain a target BMI. HM needs and immunizations were addressed and ordered. See below for orders. See HM and immunization section for updates. Routine labs and screening tests ordered including cmp, cbc and lipids where  appropriate. Discussed recommendations regarding Vit D and calcium  supplementation (see AVS)  Chronic disease management visit and/or acute problem visit: Assessment and Plan Assessment & Plan Osteoporosis of lumbar spine Osteoporosis has improved with Fosamax  treatment, transitioning from osteoporotic to osteopenic category. - Continue Fosamax  for at least five years. Remains active. Vit d and calcium . See above  Allergic rhinitis Experiencing severe allergic rhinitis symptoms including sneezing, rhinorrhea, and nasal dryness. Currently using Flonase  and a generic allergy pill, likely loratadine, which is less effective. - Switch to cetirizine (Zyrtec) for better symptom control. - Use Flonase  nasal spray regularly for several weeks.  Trochanteric bursitis Pain on the lateral aspect of the hip for the last two to three months, consistent with trochanteric bursitis. Pain is managed with arthritis cream. - Continue using arthritis cream. Rec ice and stretching - Will consider a steroid injection if symptoms do not improve. - Will consider a short course of diclofenac  if needed.  Shoulder osteoarthritis with rotator cuff tear Severe arthritis and a torn rotator cuff in the shoulder. Previous steroid injections provided variable relief. -- managed by ortho  Type 2 diabetes mellitus - will check A1c; rec diabetic diet.  - rec eye exam - urine nephropathy screen. - on ace and arb and normotensive on ace - check renal function and lytes  Essential hypertension Blood pressure is well-controlled with current medication regimen. Low dose ace.  Check renal function and lytes  Mixed hyperlipidemia -fasting for lipid recheck on statin. Monitor lfts. Push ldl < 70 ideally due to diet controlled diabetes.  Neuroapathy: continue gabapentin  and foot care      Follow up: 6 mo for recheck  Orders Placed This Encounter  Procedures   CBC with Differential/Platelet   Comprehensive  metabolic panel with GFR   Lipid panel   Hemoglobin A1c   TSH   Microalbumin / creatinine urine ratio   Meds ordered this encounter  Medications   fluticasone  (FLONASE ) 50 MCG/ACT nasal spray    Sig: Place 1 spray into both nostrils daily.    Dispense:  16 g    Refill:  6   Lidocaine -Hydrocortisone  Ace 3-0.5 % CREA    Sig: Apply topically daily as needed    Dispense:  85 g    Refill:  2      Body mass index is 24.54 kg/m. Wt Readings from Last 3 Encounters:  02/29/24 145 lb 3.2 oz (65.9 kg)  06/28/23 139 lb (63 kg)  02/27/23 144 lb 9.6 oz (65.6 kg)     Patient Active Problem List   Diagnosis Date Noted Date Diagnosed   Mixed hyperlipidemia 02/01/2022     Priority: High   Essential hypertension 02/01/2022     Priority: High   Prediabetes 09/27/2017     Priority: High   Spondylosis of lumbar region without myelopathy or radiculopathy 02/01/2022     Priority: Medium    Idiopathic peripheral neuropathy 02/01/2022     Priority: Medium    Osteoporosis of lumbar spine 11/08/2011     Priority: Medium     DEXA 10/2011; h/o actonel.  11/2013: DEXA T=-2.2, [facility]. Continue vit D and Ca. Recheck 2 years. Start meds if declining. Pt agrees.  DEXA 12/2015 = T=-2.6 at L/S. Better at other sites. rec restart meds DEXA 01/2022: lowest T= 2.8 lumbar, osteopenia at other sites. Start fosamax  [facility] DEXA 01/2024; lowest T = - 2.4, improvement in lumbar and other sites: osteopenic range after 2 years of fosamax . Recommend continuation for 5 years total. Can reassess in 2-3 years.    GERD 08/01/2008     Priority: Medium    Diverticulosis of colon 02/01/2022     Priority: Low   Dry eye syndrome 10/05/2012     Priority: Low   Chronic allergic rhinitis 03/06/2007     Priority: Low   Diet-controlled diabetes mellitus (HCC) 02/29/2024    Health Maintenance  Topic Date Due   OPHTHALMOLOGY EXAM  Never done   Medicare Annual Wellness (AWV)  06/13/2017   COVID-19 Vaccine (1) 03/16/2024  (Originally 01/18/1935)   HEMOGLOBIN A1C  08/28/2024   FOOT EXAM  02/28/2025   Bone Density Scan  01/31/2026   DTaP/Tdap/Td (3 - Td or Tdap) 12/01/2027   Pneumococcal Vaccine: 50+ Years  Completed   Influenza Vaccine  Completed   Zoster Vaccines- Shingrix   Completed   Meningococcal B Vaccine  Aged Out   Immunization History  Administered Date(s) Administered   Fluad Quad(high Dose 65+) 11/17/2016, 11/30/2017, 10/20/2020, 11/20/2020   Fluad Trivalent(High Dose 65+) 10/20/2023   INFLUENZA, HIGH DOSE SEASONAL PF 11/08/2011, 11/11/2014, 11/11/2014, 12/15/2022   Influenza,trivalent, recombinat, inj, PF 02/14/2009   Influenza-Unspecified 12/19/2013   Pneumococcal Conjugate-13 12/19/2013   Pneumococcal Polysaccharide-23 02/14/2002, 11/08/2011   Tdap 03/17/2008, 11/30/2017   Zoster Recombinant(Shingrix ) 05/07/2016, 07/07/2016   Zoster, Live 02/14/2005, 05/07/2016   Zoster, Unspecified 05/07/2016   We updated and reviewed the patient's past history in detail and it is documented below. Allergies: Patient is allergic to dust mite extract and pollen extract. Past Medical History Patient  has a past medical history of Allergic rhinitis, cause unspecified, Anal fissure, Diverticulosis of colon (without mention of hemorrhage), GERD (gastroesophageal reflux disease), Hyperlipemia, Internal hemorrhoids, Osteoarthritis, and Peripheral neuropathy. Past Surgical History Patient  has a past surgical history that includes Nasal sinus surgery; Hemorrhoid surgery; Biopsy breast; and Tonsillectomy. Family History: Patient family history includes Heart disease in her mother; Lung cancer in her father. Social History:  Patient  reports that she has never smoked. She has never used smokeless tobacco. She reports that she does not drink alcohol and does not use drugs.  Review of Systems: Constitutional: negative for fever or malaise Ophthalmic: negative for photophobia, double vision or loss of  vision Cardiovascular: negative for chest pain, dyspnea on exertion, or new LE swelling Respiratory: negative for SOB or persistent cough Gastrointestinal: negative for abdominal pain, change in bowel habits or melena Genitourinary: negative for dysuria or gross hematuria, no abnormal uterine bleeding or disharge Musculoskeletal: negative for new gait disturbance or muscular weakness Integumentary: negative for new or persistent rashes, no breast lumps Neurological: negative for TIA or stroke symptoms Psychiatric: negative for SI or delusions Allergic/Immunologic: negative for hives  Patient Care Team    Relationship Specialty Notifications Start End  Jodie Lavern CROME, MD PCP - General Family Medicine  02/01/22   Teressa Toribio SQUIBB, MD (Inactive) Consulting Physician Gastroenterology  02/01/22   Robinson Pao, MD Consulting Physician Dermatology  02/01/22     Objective  Vitals: BP 134/79   Pulse 65   Temp 97.7 F (36.5 C)   Ht 5' 4.5 (1.638 m)   Wt 145 lb 3.2 oz (65.9 kg)   SpO2 99%  BMI 24.54 kg/m  General:  Well developed, well nourished, no acute distress  Psych:  Alert and orientedx3,normal mood and affect HEENT:  Normocephalic, atraumatic, non-icteric sclera,  supple neck without adenopathy, mass or thyromegaly Cardiovascular:  Normal S1, S2, RRR without gallop, rub or murmur Respiratory:  Good breath sounds bilaterally, CTAB with normal respiratory effort Gastrointestinal: normal bowel sounds, soft, non-tender, no noted masses. No HSM MSK: extremities without edema, joints without erythema or swelling Neurologic:    Mental status is normal.  Gross motor and sensory exams are normal.  No tremor Nl foot exam  Commons side effects, risks, benefits, and alternatives for medications and treatment plan prescribed today were discussed, and the patient expressed understanding of the given instructions. Patient is instructed to call or message via MyChart if he/she has any questions  or concerns regarding our treatment plan. No barriers to understanding were identified. We discussed Red Flag symptoms and signs in detail. Patient expressed understanding regarding what to do in case of urgent or emergency type symptoms.  Medication list was reconciled, printed and provided to the patient in AVS. Patient instructions and summary information was reviewed with the patient as documented in the AVS. This note was prepared with assistance of Dragon voice recognition software. Occasional wrong-word or sound-a-like substitutions may have occurred due to the inherent limitations of voice recognition software     "

## 2024-03-03 NOTE — Progress Notes (Signed)
 See mychart note Dear Ms. Hansen, Your lab results are stable. Your sugar control is good. No changes are needed at this time. It was soooo good seeing you! Sincerely, Dr. Jodie

## 2024-03-05 ENCOUNTER — Ambulatory Visit (INDEPENDENT_AMBULATORY_CARE_PROVIDER_SITE_OTHER)

## 2024-03-05 VITALS — Ht 64.5 in | Wt 145.0 lb

## 2024-03-05 DIAGNOSIS — Z0000 Encounter for general adult medical examination without abnormal findings: Secondary | ICD-10-CM

## 2024-03-05 NOTE — Progress Notes (Signed)
 "  Chief Complaint  Patient presents with   Medicare Wellness     Subjective:   Sherry Guzman is a 89 y.o. female who presents for a Medicare Annual Wellness Visit.  Visit info / Clinical Intake: Medicare Wellness Visit Type:: Subsequent Annual Wellness Visit Persons participating in visit and providing information:: patient Medicare Wellness Visit Mode:: Telephone If telephone:: video declined Since this visit was completed virtually, some vitals may be partially provided or unavailable. Missing vitals are due to the limitations of the virtual format.: Unable to obtain vitals - no equipment If Telephone or Video please confirm:: I connected with patient using audio/video enable telemedicine. I verified patient identity with two identifiers, discussed telehealth limitations, and patient agreed to proceed. Patient Location:: home Provider Location:: office Interpreter Needed?: No Pre-visit prep was completed: yes AWV questionnaire completed by patient prior to visit?: no Living arrangements:: (!) lives alone Patient's Overall Health Status Rating: excellent Typical amount of pain: none Does pain affect daily life?: no Are you currently prescribed opioids?: no  Dietary Habits and Nutritional Risks How many meals a day?: 3 Eats fruit and vegetables daily?: yes Most meals are obtained by: preparing own meals In the last 2 weeks, have you had any of the following?: none Diabetic:: (!) yes Any non-healing wounds?: no How often do you check your BS?: 0 Would you like to be referred to a Nutritionist or for Diabetic Management? : no  Functional Status Activities of Daily Living (to include ambulation/medication): Independent Ambulation: Independent with device- listed below Home Assistive Devices/Equipment: Eyeglasses Medication Administration: Independent Home Management (perform basic housework or laundry): Independent Manage your own finances?: yes Primary transportation  is: driving Concerns about vision?: no *vision screening is required for WTM* Concerns about hearing?: no  Fall Screening Falls in the past year?: 0 Number of falls in past year: 0 Was there an injury with Fall?: 0 Fall Risk Category Calculator: 0 Patient Fall Risk Level: Low Fall Risk  Fall Risk Patient at Risk for Falls Due to: No Fall Risks Fall risk Follow up: Falls evaluation completed  Home and Transportation Safety: All rugs have non-skid backing?: yes All stairs or steps have railings?: yes Grab bars in the bathtub or shower?: (!) no Have non-skid surface in bathtub or shower?: (!) no Good home lighting?: yes Regular seat belt use?: yes Hospital stays in the last year:: no  Cognitive Assessment Difficulty concentrating, remembering, or making decisions? : no Will 6CIT or Mini Cog be Completed: yes What year is it?: 0 points What month is it?: 0 points Give patient an address phrase to remember (5 components): [address] [place]  About what time is it?: 0 points Count backwards from 20 to 1: 0 points Say the months of the year in reverse: 0 points Repeat the address phrase from earlier: 0 points 6 CIT Score: 0 points  Advance Directives (For Healthcare) Does Patient Have a Medical Advance Directive?: Yes Type of Advance Directive: Healthcare Power of Attorney Copy of Healthcare Power of Attorney in Chart?: No - copy requested  Reviewed/Updated  Reviewed/Updated: Reviewed All (Medical, Surgical, Family, Medications, Allergies, Care Teams, Patient Goals)    Allergies (verified) Dust mite extract and Pollen extract   Current Medications (verified) Outpatient Encounter Medications as of 03/05/2024  Medication Sig   alendronate  (FOSAMAX ) 70 MG tablet TAKE 1 TABLET BY MOUTH WEEKLY  WITH 8 OZ OF PLAIN WATER 30  MINUTES BEFORE FIRST FOOD, DRINK OR MEDS. STAY UPRIGHT FOR 30  MINS   ALPRAZolam  (XANAX ) 0.25 MG tablet Take 1 tablet (0.25 mg total) by mouth daily as  needed for anxiety.   Cholecalciferol (VITAMIN D3) 5000 UNITS TABS Take 1 tablet by mouth daily.    fluticasone  (FLONASE ) 50 MCG/ACT nasal spray Place 1 spray into both nostrils daily.   gabapentin  (NEURONTIN ) 300 MG capsule TAKE 1 CAPSULE BY MOUTH TWICE  DAILY   lisinopril  (ZESTRIL ) 5 MG tablet TAKE 1 TABLET BY MOUTH DAILY   rosuvastatin  (CRESTOR ) 10 MG tablet TAKE 1 TABLET BY MOUTH DAILY   Lidocaine -Hydrocortisone  Ace 3-0.5 % CREA Apply topically daily as needed (Patient not taking: Reported on 03/05/2024)   No facility-administered encounter medications on file as of 03/05/2024.    History: Past Medical History:  Diagnosis Date   Allergic rhinitis, cause unspecified    Anal fissure    Diverticulosis of colon (without mention of hemorrhage)    GERD (gastroesophageal reflux disease)    Hyperlipemia    Internal hemorrhoids    Osteoarthritis    Peripheral neuropathy    Past Surgical History:  Procedure Laterality Date   BIOPSY BREAST     HEMORRHOID SURGERY     NASAL SINUS SURGERY     TONSILLECTOMY     Family History  Problem Relation Age of Onset   Lung cancer Father    Heart disease Mother    Colon cancer Neg Hx    Colon polyps Neg Hx    Social History   Occupational History   Occupation: Retired   Tobacco Use   Smoking status: Never   Smokeless tobacco: Never  Vaping Use   Vaping status: Never Used  Substance and Sexual Activity   Alcohol use: No   Drug use: No   Sexual activity: Not Currently   Tobacco Counseling Counseling given: Not Answered  SDOH Screenings   Food Insecurity: No Food Insecurity (03/05/2024)  Housing: Low Risk (03/05/2024)  Transportation Needs: No Transportation Needs (03/05/2024)  Utilities: Not At Risk (03/05/2024)  Depression (PHQ2-9): Low Risk (03/05/2024)  Physical Activity: Sufficiently Active (03/05/2024)  Social Connections: Moderately Integrated (03/05/2024)  Stress: No Stress Concern Present (03/05/2024)  Tobacco Use: Low Risk  (03/05/2024)  Health Literacy: Adequate Health Literacy (03/05/2024)   See flowsheets for full screening details  Depression Screen PHQ 2 & 9 Depression Scale- Over the past 2 weeks, how often have you been bothered by any of the following problems? Little interest or pleasure in doing things: 0 Feeling down, depressed, or hopeless (PHQ Adolescent also includes...irritable): 0 PHQ-2 Total Score: 0     Goals Addressed               This Visit's Progress     maintain health and activity (pt-stated)        Maintain health and activity              Objective:    Today's Vitals   03/05/24 1021  Weight: 145 lb (65.8 kg)  Height: 5' 4.5 (1.638 m)   Body mass index is 24.5 kg/m.  Hearing/Vision screen Hearing Screening - Comments:: Pt denies any hearing issues  Vision Screening - Comments:: Wears rx glasses - up to date with routine eye exams with Dr Marcey at [facility] eye  Immunizations and Health Maintenance Health Maintenance  Topic Date Due   OPHTHALMOLOGY EXAM  Never done   COVID-19 Vaccine (1) 03/16/2024 (Originally 01/18/1935)   HEMOGLOBIN A1C  08/28/2024   FOOT EXAM  02/28/2025   Medicare Annual Wellness (AWV)  03/05/2025   Bone Density Scan  01/31/2026   DTaP/Tdap/Td (3 - Td or Tdap) 12/01/2027   Pneumococcal Vaccine: 50+ Years  Completed   Influenza Vaccine  Completed   Zoster Vaccines- Shingrix   Completed   Meningococcal B Vaccine  Aged Out        Assessment/Plan:  This is a routine wellness examination for Sherry Guzman.  Patient Care Team: Jodie Lavern CROME, MD as PCP - General (Family Medicine) Teressa Toribio SQUIBB, MD (Inactive) as Consulting Physician (Gastroenterology) Robinson Pao, MD as Consulting Physician (Dermatology)  I have personally reviewed and noted the following in the patients chart:   Medical and social history Use of alcohol, tobacco or illicit drugs  Current medications and supplements including opioid prescriptions. Functional ability  and status Nutritional status Physical activity Advanced directives List of other physicians Hospitalizations, surgeries, and ER visits in previous 12 months Vitals Screenings to include cognitive, depression, and falls Referrals and appointments  No orders of the defined types were placed in this encounter.  In addition, I have reviewed and discussed with patient certain preventive protocols, quality metrics, and best practice recommendations. A written personalized care plan for preventive services as well as general preventive health recommendations were provided to patient.   Sherry VEAR Haws, LPN   [id number]/79/7973   Return in about 1 year (around 03/10/2025).  After Visit Summary: (MyChart) Due to this being a telephonic visit, the after visit summary with patients personalized plan was offered to patient via MyChart   Nurse Notes: HM Addressed: Pt scheduled for eye exam 03/2024 with Dr Camillo   "

## 2024-03-05 NOTE — Patient Instructions (Signed)
 Ms. Leas,  Thank you for taking the time for your Medicare Wellness Visit. I appreciate your continued commitment to your health goals. Please review the care plan we discussed, and feel free to reach out if I can assist you further.  Please note that Annual Wellness Visits do not include a physical exam. Some assessments may be limited, especially if the visit was conducted virtually. If needed, we may recommend an in-person follow-up with your provider.  Ongoing Care Seeing your primary care provider every 3 to 6 months helps us  monitor your health and provide consistent, personalized care.   Referrals If a referral was made during today's visit and you haven't received any updates within two weeks, please contact the referred provider directly to check on the status.  Recommended Screenings:  Health Maintenance  Topic Date Due   Eye exam for diabetics  Never done   Medicare Annual Wellness Visit  06/13/2017   COVID-19 Vaccine (1) 03/16/2024*   Hemoglobin A1C  08/28/2024   Complete foot exam   02/28/2025   Osteoporosis screening with Bone Density Scan  01/31/2026   DTaP/Tdap/Td vaccine (3 - Td or Tdap) 12/01/2027   Pneumococcal Vaccine for age over 38  Completed   Flu Shot  Completed   Zoster (Shingles) Vaccine  Completed   Meningitis B Vaccine  Aged Out  *Topic was postponed. The date shown is not the original due date.        No data to display          Vision: Annual vision screenings are recommended for early detection of glaucoma, cataracts, and diabetic retinopathy. These exams can also reveal signs of chronic conditions such as diabetes and high blood pressure.  Dental: Annual dental screenings help detect early signs of oral cancer, gum disease, and other conditions linked to overall health, including heart disease and diabetes.  Please see the attached documents for additional preventive care recommendations.

## 2025-03-04 ENCOUNTER — Encounter: Admitting: Family Medicine

## 2025-03-10 ENCOUNTER — Ambulatory Visit
